# Patient Record
Sex: Female | Born: 1970 | Race: White | Hispanic: No | Marital: Married | Smoking: Never smoker
Health system: Southern US, Community
[De-identification: ages and names within clinical notes are randomized; demographics above are authoritative.]

## PROBLEM LIST (undated history)

## (undated) DIAGNOSIS — H9190 Unspecified hearing loss, unspecified ear: Secondary | ICD-10-CM

## (undated) DIAGNOSIS — K589 Irritable bowel syndrome without diarrhea: Secondary | ICD-10-CM

## (undated) DIAGNOSIS — K219 Gastro-esophageal reflux disease without esophagitis: Secondary | ICD-10-CM

## (undated) HISTORY — DX: Irritable bowel syndrome, unspecified: K58.9

## (undated) HISTORY — DX: Gastro-esophageal reflux disease without esophagitis: K21.9

---
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
## 1992-01-06 HISTORY — PX: EAR EXAMINATION UNDER ANESTHESIA: SHX1482

## 1996-01-06 HISTORY — PX: LEEP: SHX91

## 1996-01-06 HISTORY — PX: OOPHORECTOMY: SHX86

## 2000-06-23 ENCOUNTER — Other Ambulatory Visit: Admission: RE | Admit: 2000-06-23 | Discharge: 2000-06-23 | Payer: Self-pay | Admitting: *Deleted

## 2001-02-14 ENCOUNTER — Other Ambulatory Visit: Admission: RE | Admit: 2001-02-14 | Discharge: 2001-02-14 | Payer: Self-pay | Admitting: Gynecology

## 2002-03-07 ENCOUNTER — Other Ambulatory Visit: Admission: RE | Admit: 2002-03-07 | Discharge: 2002-03-07 | Payer: Self-pay | Admitting: Gynecology

## 2005-07-21 ENCOUNTER — Other Ambulatory Visit: Admission: RE | Admit: 2005-07-21 | Discharge: 2005-07-21 | Payer: Self-pay | Admitting: Obstetrics & Gynecology

## 2006-08-12 ENCOUNTER — Other Ambulatory Visit: Admission: RE | Admit: 2006-08-12 | Discharge: 2006-08-12 | Payer: Self-pay | Admitting: Obstetrics and Gynecology

## 2008-01-10 ENCOUNTER — Other Ambulatory Visit: Admission: RE | Admit: 2008-01-10 | Discharge: 2008-01-10 | Payer: Self-pay | Admitting: Obstetrics and Gynecology

## 2011-11-18 ENCOUNTER — Other Ambulatory Visit: Payer: Self-pay | Admitting: Obstetrics and Gynecology

## 2011-11-18 DIAGNOSIS — Z1231 Encounter for screening mammogram for malignant neoplasm of breast: Secondary | ICD-10-CM

## 2012-01-06 HISTORY — PX: BREAST EXCISIONAL BIOPSY: SUR124

## 2012-01-08 ENCOUNTER — Ambulatory Visit
Admission: RE | Admit: 2012-01-08 | Discharge: 2012-01-08 | Disposition: A | Discharge status: Home / Self Care | Payer: BC Managed Care – PPO | Source: Ambulatory Visit | Attending: Obstetrics and Gynecology | Admitting: Obstetrics and Gynecology

## 2012-01-08 DIAGNOSIS — Z1231 Encounter for screening mammogram for malignant neoplasm of breast: Secondary | ICD-10-CM

## 2012-01-14 ENCOUNTER — Other Ambulatory Visit: Payer: Self-pay | Admitting: Obstetrics and Gynecology

## 2012-01-14 DIAGNOSIS — R928 Other abnormal and inconclusive findings on diagnostic imaging of breast: Secondary | ICD-10-CM

## 2012-01-22 ENCOUNTER — Ambulatory Visit
Admission: RE | Admit: 2012-01-22 | Discharge: 2012-01-22 | Disposition: A | Discharge status: Home / Self Care | Payer: BC Managed Care – PPO | Source: Ambulatory Visit | Attending: Obstetrics and Gynecology | Admitting: Obstetrics and Gynecology

## 2012-01-22 DIAGNOSIS — R928 Other abnormal and inconclusive findings on diagnostic imaging of breast: Secondary | ICD-10-CM

## 2012-06-21 ENCOUNTER — Other Ambulatory Visit: Payer: Self-pay | Admitting: Nurse Practitioner

## 2012-06-21 DIAGNOSIS — R921 Mammographic calcification found on diagnostic imaging of breast: Secondary | ICD-10-CM

## 2012-06-28 ENCOUNTER — Ambulatory Visit
Admission: RE | Admit: 2012-06-28 | Discharge: 2012-06-28 | Disposition: A | Discharge status: Home / Self Care | Payer: BC Managed Care – PPO | Source: Ambulatory Visit | Attending: Nurse Practitioner | Admitting: Nurse Practitioner

## 2012-06-28 DIAGNOSIS — R921 Mammographic calcification found on diagnostic imaging of breast: Secondary | ICD-10-CM

## 2012-07-11 ENCOUNTER — Ambulatory Visit
Admission: RE | Admit: 2012-07-11 | Discharge: 2012-07-11 | Disposition: A | Discharge status: Home / Self Care | Payer: BC Managed Care – PPO | Source: Ambulatory Visit | Attending: Nurse Practitioner | Admitting: Nurse Practitioner

## 2012-07-11 DIAGNOSIS — R921 Mammographic calcification found on diagnostic imaging of breast: Secondary | ICD-10-CM

## 2012-07-14 ENCOUNTER — Telehealth: Payer: Self-pay | Admitting: Nurse Practitioner

## 2012-07-14 NOTE — Telephone Encounter (Signed)
Patient would  like to talk to speak Shirlyn Goltz before her consult tomorrow . Please call her back to discuss her concerns.

## 2012-07-14 NOTE — Telephone Encounter (Signed)
Patient had first MMG in Jan 2014 and 6 mth follow up was ordered.  She had this 06-2012 and biopsy of calcification was unsuccessful.  Initially they said they would just follow up in 6 months.  Then after reviewing films,a change was noted so she was referred to surgeon for removal.  Patient just would like Patty's opinion.  Is this "overkill" to be referred to surgeon?  Would a surgeon really give any option other than surgery?  Patient would just like an impartial opinion of someone that knows her.  Support given and advised that we almost always agree with referral to surgeons and even sometimes refer to surgeon when lump is radiologically ok.  Also encouraged to see 2nd opinion if desires as it is most important that SHE is comfortable with plan.  Would just like to hear from [facility] about all of this.

## 2012-07-14 NOTE — Telephone Encounter (Signed)
Discussed with patient her Mammo findings and need to see Dr. Jamey Ripa.  We reviewed Mammo from January and this last one.  I agree with radiologist to have surgical opinion.  Patient feels more comfortable with decision.

## 2012-07-15 ENCOUNTER — Encounter (INDEPENDENT_AMBULATORY_CARE_PROVIDER_SITE_OTHER): Payer: Self-pay | Admitting: Surgery

## 2012-07-15 ENCOUNTER — Ambulatory Visit (INDEPENDENT_AMBULATORY_CARE_PROVIDER_SITE_OTHER): Payer: BC Managed Care – PPO | Admitting: Surgery

## 2012-07-15 VITALS — BP 110/64 | HR 70 | Resp 18 | Ht 64.0 in | Wt 124.0 lb

## 2012-07-15 DIAGNOSIS — R921 Mammographic calcification found on diagnostic imaging of breast: Secondary | ICD-10-CM

## 2012-07-15 DIAGNOSIS — R928 Other abnormal and inconclusive findings on diagnostic imaging of breast: Secondary | ICD-10-CM

## 2012-07-15 NOTE — Progress Notes (Signed)
NAME: Elaine Edwards DOB: 07/17/1970 MRN: [id number]                                                                                      DATE: 07/15/2012  PCP: ROMINE,CYNTHIA P, MD Referring Provider: No ref. provider found  IMPRESSION:  Right breast calcifications, upper outer quadrant, indeterminate, increasing, not amenable to NCB  PLAN:   I believe we should proceed with NL excsion, but an alternative is a 6 mont h f/u mammo. The calcs are slightly worrisome on films, and have somewhat increased.   I have discussed the indications for the lumpectomy and described the procedure. She understand that the chance of removal of the abnormal area is very good, but that occasionally we are unable to locate it and may have to do a second procedure. We also discussed the possibility of a second procedure to get additional tissue. Risks of surgery such as bleeding and infection have also been explained, as well as the implications of not doing the surgery. She understands and wishes to proceed.                  CC:  Chief Complaint  Patient presents with  . Breast Problem    Eval breast calcification    HPI:  Elaine Edwards is a 41 y.o.  female who presents for evaluation of right breast calcs. They were seenn on a mmo in January, and agin now. They are slighly worrisome, and have somewhat increased over the six month interval. NCB could not be done due to inability to see them well enough on the mammo unit and their proximity to the chest  PMH:  has no past medical history on file.  PSH:   has past surgical history that includes right ovary removed.  ALLERGIES:  Not on File  MEDICATIONS: No current outpatient prescriptions on file.  ROS: She has filled out our 12 point review of systems and it is negative . EXAM:   VITAL SIGNS:  BP 110/64  Pulse 70  Resp 18  Ht 5' 4" (1.626 m)  Wt 124 lb (56.246 kg)  BMI 21.27 kg/m2  GENERAL:  The patient is alert, oriented, and  generally healthy-appearing, NAD. Mood and affect are normal.  HEENT:  The head is normocephalic, the eyes nonicteric, the pupils were round regular and equal. EOMs are normal. Pharynx normal. Dentition good.  NECK:  The neck is supple and there are no masses or thyromegaly.  LUNGS: Normal respirations and clear to auscultation.  HEART: Regular rhythm, with no murmurs rubs or gallops. Pulses are intact carotid dorsalis pedis and posterior tibial. No significant varicosities are noted.  BREASTS:  Breasts are somewhat dense, no mass, not tender, skin, nipple areola OK.  Lymphatics: no axillary or supraclavicular adenopathy  ABDOMEN: Soft, flat, and nontender. No masses or organomegaly is noted. No hernias are noted. Bowel sounds are normal.  EXTREMITIES:  Good range of motion, no edema.   DATA REVIEWED:  I have reviewed the mammo films and reports with the radiologist    Dennise Bamber J 07/15/2012  CC: No ref. provider found, ROMINE,CYNTHIA P, MD        

## 2012-07-15 NOTE — Patient Instructions (Signed)
I will call you this afternoon after I have a chance to review the mammogram films

## 2012-08-04 ENCOUNTER — Encounter (HOSPITAL_BASED_OUTPATIENT_CLINIC_OR_DEPARTMENT_OTHER): Payer: Self-pay | Admitting: *Deleted

## 2012-08-04 NOTE — Progress Notes (Signed)
No health issues 

## 2012-08-10 ENCOUNTER — Encounter (HOSPITAL_BASED_OUTPATIENT_CLINIC_OR_DEPARTMENT_OTHER)
Admission: RE | Disposition: A | Discharge status: Home / Self Care | Payer: Self-pay | Source: Ambulatory Visit | Attending: Surgery

## 2012-08-10 ENCOUNTER — Ambulatory Visit
Admission: RE | Admit: 2012-08-10 | Discharge: 2012-08-10 | Disposition: A | Discharge status: Home / Self Care | Payer: BC Managed Care – PPO | Source: Ambulatory Visit | Attending: Surgery | Admitting: Surgery

## 2012-08-10 ENCOUNTER — Encounter (HOSPITAL_BASED_OUTPATIENT_CLINIC_OR_DEPARTMENT_OTHER): Payer: Self-pay | Admitting: Certified Registered Nurse Anesthetist

## 2012-08-10 ENCOUNTER — Ambulatory Visit (HOSPITAL_BASED_OUTPATIENT_CLINIC_OR_DEPARTMENT_OTHER): Payer: BC Managed Care – PPO | Admitting: Certified Registered Nurse Anesthetist

## 2012-08-10 ENCOUNTER — Ambulatory Visit (HOSPITAL_BASED_OUTPATIENT_CLINIC_OR_DEPARTMENT_OTHER)
Admission: RE | Admit: 2012-08-10 | Discharge: 2012-08-10 | Disposition: A | Discharge status: Home / Self Care | Payer: BC Managed Care – PPO | Source: Ambulatory Visit | Attending: Surgery | Admitting: Surgery

## 2012-08-10 DIAGNOSIS — N6019 Diffuse cystic mastopathy of unspecified breast: Secondary | ICD-10-CM

## 2012-08-10 DIAGNOSIS — R921 Mammographic calcification found on diagnostic imaging of breast: Secondary | ICD-10-CM

## 2012-08-10 DIAGNOSIS — N62 Hypertrophy of breast: Secondary | ICD-10-CM | POA: Insufficient documentation

## 2012-08-10 DIAGNOSIS — D249 Benign neoplasm of unspecified breast: Secondary | ICD-10-CM

## 2012-08-10 HISTORY — DX: Unspecified hearing loss, unspecified ear: H91.90

## 2012-08-10 HISTORY — PX: BREAST BIOPSY: SHX20

## 2012-08-10 LAB — POCT HEMOGLOBIN-HEMACUE: Hemoglobin: 13 g/dL (ref 12.0–15.0)

## 2012-08-10 SURGERY — BREAST BIOPSY WITH NEEDLE LOCALIZATION
Anesthesia: General | Site: Breast | Laterality: Right | Wound class: Clean

## 2012-08-10 MED ORDER — OXYCODONE HCL 5 MG/5ML PO SOLN: 5.0000 mg | Freq: Once | ORAL | Status: DC | PRN

## 2012-08-10 MED ORDER — LIDOCAINE HCL (CARDIAC) 20 MG/ML IV SOLN
INTRAVENOUS | Status: DC | PRN
  Administered 2012-08-10: 60 mg via INTRAVENOUS

## 2012-08-10 MED ORDER — PROPOFOL 10 MG/ML IV BOLUS
INTRAVENOUS | Status: DC | PRN
  Administered 2012-08-10: 200 mg via INTRAVENOUS

## 2012-08-10 MED ORDER — FENTANYL CITRATE 0.05 MG/ML IJ SOLN
INTRAMUSCULAR | Status: DC | PRN
  Administered 2012-08-10: 25 ug via INTRAVENOUS
  Administered 2012-08-10: 100 ug via INTRAVENOUS

## 2012-08-10 MED ORDER — MIDAZOLAM HCL 5 MG/5ML IJ SOLN
INTRAMUSCULAR | Status: DC | PRN
  Administered 2012-08-10: 2 mg via INTRAVENOUS

## 2012-08-10 MED ORDER — HYDROCODONE-ACETAMINOPHEN 5-325 MG PO TABS: 1.0000 | ORAL_TABLET | ORAL | Status: DC | PRN

## 2012-08-10 MED ORDER — ONDANSETRON HCL 4 MG/2ML IJ SOLN: 4.0000 mg | Freq: Once | INTRAMUSCULAR | Status: DC | PRN

## 2012-08-10 MED ORDER — CEFAZOLIN SODIUM-DEXTROSE 2-3 GM-% IV SOLR
2.0000 g | INTRAVENOUS | Status: AC
  Administered 2012-08-10: 2 g via INTRAVENOUS

## 2012-08-10 MED ORDER — ONDANSETRON HCL 4 MG/2ML IJ SOLN
INTRAMUSCULAR | Status: DC | PRN
  Administered 2012-08-10: 4 mg via INTRAVENOUS

## 2012-08-10 MED ORDER — DEXAMETHASONE SODIUM PHOSPHATE 4 MG/ML IJ SOLN
INTRAMUSCULAR | Status: DC | PRN
  Administered 2012-08-10: 10 mg via INTRAVENOUS

## 2012-08-10 MED ORDER — CHLORHEXIDINE GLUCONATE 4 % EX LIQD: 1.0000 "application " | Freq: Once | CUTANEOUS | Status: DC

## 2012-08-10 MED ORDER — OXYCODONE HCL 5 MG PO TABS: 5.0000 mg | ORAL_TABLET | Freq: Once | ORAL | Status: DC | PRN

## 2012-08-10 MED ORDER — HYDROMORPHONE HCL PF 1 MG/ML IJ SOLN
0.2500 mg | INTRAMUSCULAR | Status: DC | PRN
  Administered 2012-08-10 (×3): 0.25 mg via INTRAVENOUS

## 2012-08-10 MED ORDER — LACTATED RINGERS IV SOLN
INTRAVENOUS | Status: DC
  Administered 2012-08-10 (×2): via INTRAVENOUS

## 2012-08-10 MED ORDER — BUPIVACAINE HCL (PF) 0.25 % IJ SOLN
INTRAMUSCULAR | Status: DC | PRN
  Administered 2012-08-10: 20 mL

## 2012-08-10 MED ORDER — MIDAZOLAM HCL 2 MG/2ML IJ SOLN: 1.0000 mg | INTRAMUSCULAR | Status: DC | PRN

## 2012-08-10 MED ORDER — FENTANYL CITRATE 0.05 MG/ML IJ SOLN: 50.0000 ug | INTRAMUSCULAR | Status: DC | PRN

## 2012-08-10 SURGICAL SUPPLY — 51 items
ADH SKN CLS APL DERMABOND .7 (GAUZE/BANDAGES/DRESSINGS) ×1
APPLICATOR COTTON TIP 6IN STRL (MISCELLANEOUS) IMPLANT
BINDER BREAST LRG (GAUZE/BANDAGES/DRESSINGS) IMPLANT
BINDER BREAST MEDIUM (GAUZE/BANDAGES/DRESSINGS) ×1 IMPLANT
BINDER BREAST XLRG (GAUZE/BANDAGES/DRESSINGS) IMPLANT
BINDER BREAST XXLRG (GAUZE/BANDAGES/DRESSINGS) IMPLANT
BLADE HEX COATED 2.75 (ELECTRODE) ×2 IMPLANT
BLADE SURG 15 STRL LF DISP TIS (BLADE) ×1 IMPLANT
BLADE SURG 15 STRL SS (BLADE) ×2
CANISTER SUCTION 1200CC (MISCELLANEOUS) ×2 IMPLANT
CHLORAPREP W/TINT 26ML (MISCELLANEOUS) ×2 IMPLANT
CLIP TI MEDIUM 6 (CLIP) IMPLANT
CLIP TI WIDE RED SMALL 6 (CLIP) IMPLANT
CLOTH BEACON ORANGE TIMEOUT ST (SAFETY) ×2 IMPLANT
COVER MAYO STAND STRL (DRAPES) ×2 IMPLANT
COVER TABLE BACK 60X90 (DRAPES) ×2 IMPLANT
DECANTER SPIKE VIAL GLASS SM (MISCELLANEOUS) ×1 IMPLANT
DERMABOND ADVANCED (GAUZE/BANDAGES/DRESSINGS) ×1
DERMABOND ADVANCED .7 DNX12 (GAUZE/BANDAGES/DRESSINGS) ×1 IMPLANT
DEVICE DUBIN W/COMP PLATE 8390 (MISCELLANEOUS) ×2 IMPLANT
DRAPE LAPAROTOMY TRNSV 102X78 (DRAPE) ×2 IMPLANT
DRAPE SURG 17X23 STRL (DRAPES) IMPLANT
DRAPE UTILITY XL STRL (DRAPES) ×2 IMPLANT
ELECT COATED BLADE 2.86 ST (ELECTRODE) ×1 IMPLANT
ELECT REM PT RETURN 9FT ADLT (ELECTROSURGICAL) ×2
ELECTRODE REM PT RTRN 9FT ADLT (ELECTROSURGICAL) ×1 IMPLANT
GLOVE BIOGEL PI IND STRL 6.5 (GLOVE) ×1 IMPLANT
GLOVE BIOGEL PI INDICATOR 6.5 (GLOVE) ×1
GLOVE ECLIPSE 6.5 STRL STRAW (GLOVE) ×2 IMPLANT
GLOVE EUDERMIC 7 POWDERFREE (GLOVE) ×2 IMPLANT
GLOVE EXAM NITRILE MD LF STRL (GLOVE) ×1 IMPLANT
GOWN PREVENTION PLUS XLARGE (GOWN DISPOSABLE) ×4 IMPLANT
KIT MARKER MARGIN INK (KITS) IMPLANT
NDL HYPO 25X1 1.5 SAFETY (NEEDLE) ×1 IMPLANT
NEEDLE HYPO 25X1 1.5 SAFETY (NEEDLE) ×2 IMPLANT
NS IRRIG 1000ML POUR BTL (IV SOLUTION) ×2 IMPLANT
PACK BASIN DAY SURGERY FS (CUSTOM PROCEDURE TRAY) ×2 IMPLANT
PENCIL BUTTON HOLSTER BLD 10FT (ELECTRODE) ×2 IMPLANT
SHEET MEDIUM DRAPE 40X70 STRL (DRAPES) IMPLANT
SLEEVE SCD COMPRESS KNEE MED (MISCELLANEOUS) ×2 IMPLANT
SPONGE GAUZE 4X4 12PLY (GAUZE/BANDAGES/DRESSINGS) IMPLANT
SPONGE INTESTINAL PEANUT (DISPOSABLE) IMPLANT
SPONGE LAP 4X18 X RAY DECT (DISPOSABLE) ×2 IMPLANT
STAPLER VISISTAT 35W (STAPLE) IMPLANT
SUT MNCRL AB 4-0 PS2 18 (SUTURE) ×2 IMPLANT
SUT SILK 0 TIES 10X30 (SUTURE) IMPLANT
SUT VICRYL 3-0 CR8 SH (SUTURE) ×2 IMPLANT
SYR CONTROL 10ML LL (SYRINGE) ×2 IMPLANT
TOWEL OR NON WOVEN STRL DISP B (DISPOSABLE) ×2 IMPLANT
TUBE CONNECTING 20X1/4 (TUBING) ×2 IMPLANT
YANKAUER SUCT BULB TIP NO VENT (SUCTIONS) ×2 IMPLANT

## 2012-08-10 NOTE — H&P (View-Only) (Signed)
NAME: REGINIA BATTIE DOB: 22-Jan-1970 MRN: [id number]                                                                                      DATE: 07/15/2012  PCP: Alison Murray, MD Referring Provider: No ref. provider found  IMPRESSION:  Right breast calcifications, upper outer quadrant, indeterminate, increasing, not amenable to NCB  PLAN:   I believe we should proceed with NL excsion, but an alternative is a 6 mont h f/u mammo. The calcs are slightly worrisome on films, and have somewhat increased.   I have discussed the indications for the lumpectomy and described the procedure. She understand that the chance of removal of the abnormal area is very good, but that occasionally we are unable to locate it and may have to do a second procedure. We also discussed the possibility of a second procedure to get additional tissue. Risks of surgery such as bleeding and infection have also been explained, as well as the implications of not doing the surgery. She understands and wishes to proceed.                  CC:  Chief Complaint  Patient presents with  . Breast Problem    Eval breast calcification    HPI:  NIDA MANFREDI is a 42 y.o.  female who presents for evaluation of right breast calcs. They were seenn on a mmo in January, and agin now. They are slighly worrisome, and have somewhat increased over the six month interval. NCB could not be done due to inability to see them well enough on the mammo unit and their proximity to the chest  PMH:  has no past medical history on file.  PSH:   has past surgical history that includes right ovary removed.  ALLERGIES:  Not on File  MEDICATIONS: No current outpatient prescriptions on file.  ROS: She has filled out our 12 point review of systems and it is negative . EXAM:   VITAL SIGNS:  BP 110/64  Pulse 70  Resp 18  Ht 5\' 4"  (1.626 m)  Wt 124 lb (56.246 kg)  BMI 21.27 kg/m2  GENERAL:  The patient is alert, oriented, and  generally healthy-appearing, NAD. Mood and affect are normal.  HEENT:  The head is normocephalic, the eyes nonicteric, the pupils were round regular and equal. EOMs are normal. Pharynx normal. Dentition good.  NECK:  The neck is supple and there are no masses or thyromegaly.  LUNGS: Normal respirations and clear to auscultation.  HEART: Regular rhythm, with no murmurs rubs or gallops. Pulses are intact carotid dorsalis pedis and posterior tibial. No significant varicosities are noted.  BREASTS:  Breasts are somewhat dense, no mass, not tender, skin, nipple areola OK.  Lymphatics: no axillary or supraclavicular adenopathy  ABDOMEN: Soft, flat, and nontender. No masses or organomegaly is noted. No hernias are noted. Bowel sounds are normal.  EXTREMITIES:  Good range of motion, no edema.   DATA REVIEWED:  I have reviewed the mammo films and reports with the radiologist    Niam Nepomuceno J 07/15/2012  CC: No ref. provider found, ROMINE,CYNTHIA P, MD

## 2012-08-10 NOTE — Transfer of Care (Signed)
Immediate Anesthesia Transfer of Care Note  Patient: Elaine Edwards  Procedure(s) Performed: Procedure(s): NEEDLE LOCALIZATION REMOVAL RIGHT BREAST CALCIFICATIONS (Right)  Patient Location: PACU  Anesthesia Type:General  Level of Consciousness: awake, alert , oriented and patient cooperative  Airway & Oxygen Therapy: Patient Spontanous Breathing and Patient connected to face mask oxygen  Post-op Assessment: Report given to PACU RN and Post -op Vital signs reviewed and stable  Post vital signs: Reviewed and stable  Complications: No apparent anesthesia complications

## 2012-08-10 NOTE — Interval H&P Note (Signed)
History and Physical Interval Note:  08/10/2012 11:02 AM  Elaine Edwards  has presented today for surgery, with the diagnosis of right breast calcifications  The various methods of treatment have been discussed with the patient and family. After consideration of risks, benefits and other options for treatment, the patient has consented to  Procedure(s): NEEDLE LOCALIZATION REMOVAL RIGHT BREAST CALCIFICATIONS (Right) as a surgical intervention .  The patient's history has been reviewed, patient examined, no change in status, stable for surgery.  I have reviewed the patient's chart and labs.  Questions were answered to the patient's satisfaction.   The wire films are reviewed and discussed with Dr Jean Rosenthal; the left breast is marked as the operative site  [name] J

## 2012-08-10 NOTE — Interval H&P Note (Signed)
History and Physical Interval Note:  08/10/2012 10:53 AM  Elaine Edwards  has presented today for surgery, with the diagnosis of right breast calcifications  The various methods of treatment have been discussed with the patient and family. After consideration of risks, benefits and other options for treatment, the patient has consented to  Procedure(s): NEEDLE LOCALIZATION REMOVAL RIGHT BREAST CALCIFICATIONS (Right) as a surgical intervention .  The patient's history has been reviewed, patient examined, no change in status, stable for surgery.  I have reviewed the patient's chart and labs.  Questions were answered to the patient's satisfaction.     [name]

## 2012-08-10 NOTE — Anesthesia Postprocedure Evaluation (Signed)
  Anesthesia Post-op Note  Patient: Elaine Edwards  Procedure(s) Performed: Procedure(s): NEEDLE LOCALIZATION REMOVAL RIGHT BREAST CALCIFICATIONS (Right)  Patient Location: PACU  Anesthesia Type:General  Level of Consciousness: awake, alert  and oriented  Airway and Oxygen Therapy: Patient Spontanous Breathing  Post-op Pain: mild  Post-op Assessment: Post-op Vital signs reviewed  Post-op Vital Signs: Reviewed  Complications: No apparent anesthesia complications

## 2012-08-10 NOTE — Anesthesia Preprocedure Evaluation (Addendum)
Anesthesia Evaluation  Patient identified by MRN, date of birth, ID band Patient awake    Reviewed: Allergy & Precautions, NPO status   Airway Mallampati: I TM Distance: >3 FB Neck ROM: Full    Dental  (+) Teeth Intact and Dental Advisory Given   Pulmonary  breath sounds clear to auscultation        Cardiovascular Rhythm:Regular Rate:Normal     Neuro/Psych    GI/Hepatic   Endo/Other    Renal/GU      Musculoskeletal   Abdominal   Peds  Hematology   Anesthesia Other Findings   Reproductive/Obstetrics                          Anesthesia Physical Anesthesia Plan  ASA: I  Anesthesia Plan: General   Post-op Pain Management:    Induction: Intravenous  Airway Management Planned: LMA  Additional Equipment:   Intra-op Plan:   Post-operative Plan: Extubation in OR  Informed Consent: I have reviewed the patients History and Physical, chart, labs and discussed the procedure including the risks, benefits and alternatives for the proposed anesthesia with the patient or authorized representative who has indicated his/her understanding and acceptance.   Dental advisory given  Plan Discussed with: CRNA, Anesthesiologist and Surgeon  Anesthesia Plan Comments:         Anesthesia Quick Evaluation  

## 2012-08-10 NOTE — Op Note (Signed)
Elaine Edwards 30-Sep-1970 [id number] 07/18/2012  Preoperative diagnosis: right breast calcifications, upper outer quadrant, indeterminate,  Postoperative diagnosis: same  Procedure: wire localized excision right breast calcifications  Surgeon: Currie Paris, MD, FACS   Anesthesia: GA combined with regional for post-op pain   Clinical History and Indications: this patient has some slightly increasing but faint calcifications in the upper-outer quadrant of the right breast. After review of her situation she elected to proceed to a wire localized excisional biopsy.    Description of Procedure: I saw the patient in the preoperative area, confirmed the plans with her, more to the right breast as the operative site, and reviewed the wire localizing films with the radiologist. The patient was then taken to the operating room, and after satisfactory general anesthesia was obtained the right breast was prepped and draped and a time out performed.  The guidewire entered in the upper-outer quadrant and tracked horizontally. I made a curvilinear incision below the guidewire to be centered over the mid to medial portion of the wire where the calcifications were located.I raised skin flaps superiorly and then manipulated the wire into the wound. I then excised a cylinder of tissue around the wound taking care to get all of the anterior breast tissue since the calcifications appeared anterior to the guidewire. The tip of the guidewire appeared to be at the pectoral muscle area. I thought I had the area completely excised. A specimen mammogram showed the calcifications in the specimen and were reviewed with the radiologist.  I have treated 20 cc of 0.25% plain Marcaine. I closed the incision in layers after making sure everything was dry. I used 3-0 Vicryl, 4-0 Monocryl subcuticular and Dermabond.  The patient tolerated the procedure well. There were no complications. Counts were correct. Blood loss  was minimal.  Currie Paris, MD, FACS 08/10/2012 12:13 PM

## 2012-08-10 NOTE — Anesthesia Procedure Notes (Signed)
Procedure Name: LMA Insertion Date/Time: 08/10/2012 11:14 AM Performed by: Caren Macadam Pre-anesthesia Checklist: Patient identified, Emergency Drugs available, Suction available and Patient being monitored Patient Re-evaluated:Patient Re-evaluated prior to inductionOxygen Delivery Method: Circle System Utilized Preoxygenation: Pre-oxygenation with 100% oxygen Intubation Type: IV induction Ventilation: Mask ventilation without difficulty LMA: LMA inserted LMA Size: 4.0 Number of attempts: 1 Airway Equipment and Method: bite block Placement Confirmation: positive ETCO2 and breath sounds checked- equal and bilateral Tube secured with: Tape Dental Injury: Teeth and Oropharynx as per pre-operative assessment

## 2012-08-12 ENCOUNTER — Telehealth (INDEPENDENT_AMBULATORY_CARE_PROVIDER_SITE_OTHER): Payer: Self-pay

## 2012-08-12 ENCOUNTER — Encounter (HOSPITAL_BASED_OUTPATIENT_CLINIC_OR_DEPARTMENT_OTHER): Payer: Self-pay | Admitting: Surgery

## 2012-08-12 NOTE — Progress Notes (Signed)
Quick Note:  Tell her path is benign and as expected ______ 

## 2012-08-12 NOTE — Telephone Encounter (Signed)
Spoke with pt, giving her pathology results = benign as expected.

## 2012-08-23 ENCOUNTER — Ambulatory Visit (INDEPENDENT_AMBULATORY_CARE_PROVIDER_SITE_OTHER): Payer: BC Managed Care – PPO | Admitting: Surgery

## 2012-08-23 ENCOUNTER — Encounter (INDEPENDENT_AMBULATORY_CARE_PROVIDER_SITE_OTHER): Payer: Self-pay | Admitting: Surgery

## 2012-08-23 VITALS — BP 102/66 | HR 76 | Resp 14 | Ht 64.5 in | Wt 127.0 lb

## 2012-08-23 DIAGNOSIS — R921 Mammographic calcification found on diagnostic imaging of breast: Secondary | ICD-10-CM

## 2012-08-23 DIAGNOSIS — R928 Other abnormal and inconclusive findings on diagnostic imaging of breast: Secondary | ICD-10-CM

## 2012-08-23 NOTE — Patient Instructions (Signed)
We will see you again on an as needed basis. Please call the office at [phone] if you have any questions or concerns. Thank you for allowing us to take care of you.  

## 2012-08-23 NOTE — Progress Notes (Signed)
NAME: Elaine Edwards                                            DOB: 08-12-1970 DATE: 08/23/2012                                                  MRN: [id number]  CC:  Chief Complaint  Patient presents with  . Routine Post Op    1st po rt br    HPI: This patient comes in for post op follow-up .Sheunderwent wire localized removal of some right breast calcifications on 08/09/12. She feels that she is doing well.  PE:  VITAL SIGNS: BP 102/66  Pulse 76  Resp 14  Ht 5' 4.5" (1.638 m)  Wt 127 lb (57.607 kg)  BMI 21.47 kg/m2  LMP 07/19/2012  General: The patient appears to be healthy, NAD Incision: Healing nicely, no evidence of problems.  DATA REVIEWED: Diagnosis Breast, lumpectomy, Right - FIBROCYSTIC CHANGES WITH CALCIFICTIONS. - PSEUDOANGIOMATOUS STROMAL HYPERPLASIA (PASH). - NO EVIDENCE OF MALIGNANCY IDENTIFIED. Jimmy Picket MD Pathologist, Electronic Signature (Case signed 08/12/2012)  IMPRESSION: The patient is doing well S/P NL exciosn breast calcifications.    PLAN: RTC PRN Discussed with patient and gave her a copy of the path

## 2012-08-26 ENCOUNTER — Telehealth: Payer: Self-pay | Admitting: *Deleted

## 2012-08-26 NOTE — Telephone Encounter (Signed)
Patient out of hold in mammogram book per Shirlyn Goltz and Dr. Edward Jolly.

## 2012-09-08 ENCOUNTER — Telehealth: Payer: Self-pay | Admitting: Nurse Practitioner

## 2012-09-08 NOTE — Telephone Encounter (Signed)
pt has a yeast infection. She has tried the 3 day Monistat but it is not working pt would like an appointment today if possible

## 2012-09-08 NOTE — Telephone Encounter (Signed)
Spoke with pt about an appt tomorrow with PG at 11:15. Pt agreeable.

## 2012-09-09 ENCOUNTER — Encounter: Payer: Self-pay | Admitting: Nurse Practitioner

## 2012-09-09 ENCOUNTER — Ambulatory Visit (INDEPENDENT_AMBULATORY_CARE_PROVIDER_SITE_OTHER): Payer: BC Managed Care – PPO | Admitting: Nurse Practitioner

## 2012-09-09 VITALS — BP 100/60 | HR 68 | Wt 125.0 lb

## 2012-09-09 DIAGNOSIS — B373 Candidiasis of vulva and vagina: Secondary | ICD-10-CM

## 2012-09-09 DIAGNOSIS — B3731 Acute candidiasis of vulva and vagina: Secondary | ICD-10-CM

## 2012-09-09 MED ORDER — FLUCONAZOLE 150 MG PO TABS: 150.0000 mg | ORAL_TABLET | Freq: Once | ORAL | Status: DC

## 2012-09-09 NOTE — Patient Instructions (Signed)
Monilial Vaginitis  Vaginitis in a soreness, swelling and redness (inflammation) of the vagina and vulva. Monilial vaginitis is not a sexually transmitted infection.  CAUSES   Yeast vaginitis is caused by yeast (candida) that is normally found in your vagina. With a yeast infection, the candida has overgrown in number to a point that upsets the chemical balance.  SYMPTOMS   · White, thick vaginal discharge.  · Swelling, itching, redness and irritation of the vagina and possibly the lips of the vagina (vulva).  · Burning or painful urination.  · Painful intercourse.  DIAGNOSIS   Things that may contribute to monilial vaginitis are:  · Postmenopausal and virginal states.  · Pregnancy.  · Infections.  · Being tired, sick or stressed, especially if you had monilial vaginitis in the past.  · Diabetes. Good control will help lower the chance.  · Birth control pills.  · Tight fitting garments.  · Using bubble bath, feminine sprays, douches or deodorant tampons.  · Taking certain medications that kill germs (antibiotics).  · Sporadic recurrence can occur if you become ill.  TREATMENT   Your caregiver will give you medication.  · There are several kinds of anti monilial vaginal creams and suppositories specific for monilial vaginitis. For recurrent yeast infections, use a suppository or cream in the vagina 2 times a week, or as directed.  · Anti-monilial or steroid cream for the itching or irritation of the vulva may also be used. Get your caregiver's permission.  · Painting the vagina with methylene blue solution may help if the monilial cream does not work.  · Eating yogurt may help prevent monilial vaginitis.  HOME CARE INSTRUCTIONS   · Finish all medication as prescribed.  · Do not have sex until treatment is completed or after your caregiver tells you it is okay.  · Take warm sitz baths.  · Do not douche.  · Do not use tampons, especially scented ones.  · Wear cotton underwear.  · Avoid tight pants and panty  hose.  · Tell your sexual partner that you have a yeast infection. They should go to their caregiver if they have symptoms such as mild rash or itching.  · Your sexual partner should be treated as well if your infection is difficult to eliminate.  · Practice safer sex. Use condoms.  · Some vaginal medications cause latex condoms to fail. Vaginal medications that harm condoms are:  · Cleocin cream.  · Butoconazole (Femstat®).  · Terconazole (Terazol®) vaginal suppository.  · Miconazole (Monistat®) (may be purchased over the counter).  SEEK MEDICAL CARE IF:   · You have a temperature by mouth above 102° F (38.9° C).  · The infection is getting worse after 2 days of treatment.  · The infection is not getting better after 3 days of treatment.  · You develop blisters in or around your vagina.  · You develop vaginal bleeding, and it is not your menstrual period.  · You have pain when you urinate.  · You develop intestinal problems.  · You have pain with sexual intercourse.  Document Released: 10/01/2004 Document Revised: 03/16/2011 Document Reviewed: 06/15/2008  [employer]® Patient Information ©2014 [employer].

## 2012-09-09 NOTE — Progress Notes (Signed)
Subjective:     Patient ID: Elaine Edwards, female   DOB: [birth year], 42 y.o.   MRN: [id number]  Vaginal Itching The patient's pertinent negatives include no pelvic pain or vaginal discharge. Pertinent negatives include no chills, dysuria, fever, flank pain or urgency.   42 yo WM Fe with yeast vaginitis since last Monday. Slight itching after returning from the beach and used OTC Monistat 3 night treatment.  She had such a reaction after each dose of Monistat that required ice pack for two hours.she is better with vaginal discharge but feels irritated externally.  No bladder symptoms, fever, chills.  Review of Systems  Constitutional: Negative for fever, chills and fatigue.  HENT: Negative.   Respiratory: Negative.   Cardiovascular: Negative.   Gastrointestinal: Negative.   Genitourinary: Positive for vaginal pain. Negative for dysuria, urgency, flank pain, decreased urine volume, vaginal discharge, genital sores and pelvic pain.       No current vaginal pain but did have symptoms with dosing of Monistat.  Neurological: Negative.   Psychiatric/Behavioral: Negative.        Objective:   Physical Exam  Constitutional: She is oriented to person, place, and time. She appears well-developed and well-nourished.  Abdominal: Soft. She exhibits no distension and no mass. There is no tenderness. There is no rebound and no guarding.  Genitourinary:  No vaginal discharge or lesions. Externally at the peri anal areas there is irritation consistent with yeast.  Neurological: She is alert and oriented to person, place, and time.  Psychiatric: She has a normal mood and affect. Her behavior is normal. Judgment and thought content normal.       Assessment:     Yeast vaginitis resolved External yeast symptoms.    Plan:     Diflucan 150 mg X 2 doses (may only need 1 tablet) Will use ORC hydrocortisone to relieve discomfort and itching prn.  If symptoms not improved to return.

## 2012-09-11 NOTE — Progress Notes (Signed)
Encounter reviewed by Dr. Brook Silva.  

## 2012-11-10 ENCOUNTER — Other Ambulatory Visit: Payer: Self-pay

## 2012-12-06 ENCOUNTER — Ambulatory Visit: Payer: Self-pay | Admitting: Nurse Practitioner

## 2012-12-19 ENCOUNTER — Encounter: Payer: Self-pay | Admitting: Nurse Practitioner

## 2012-12-20 ENCOUNTER — Ambulatory Visit: Payer: Self-pay | Admitting: Nurse Practitioner

## 2014-03-30 ENCOUNTER — Other Ambulatory Visit: Payer: Self-pay

## 2014-03-30 DIAGNOSIS — Z1231 Encounter for screening mammogram for malignant neoplasm of breast: Secondary | ICD-10-CM

## 2014-04-06 ENCOUNTER — Ambulatory Visit
Admission: RE | Admit: 2014-04-06 | Discharge: 2014-04-06 | Disposition: A | Discharge status: Home / Self Care | Payer: Commercial Managed Care - PPO | Source: Ambulatory Visit

## 2014-04-06 DIAGNOSIS — Z1231 Encounter for screening mammogram for malignant neoplasm of breast: Secondary | ICD-10-CM

## 2014-08-03 ENCOUNTER — Ambulatory Visit: Payer: Self-pay | Admitting: Nurse Practitioner

## 2014-09-03 ENCOUNTER — Encounter: Payer: Self-pay | Admitting: Nurse Practitioner

## 2014-09-03 ENCOUNTER — Ambulatory Visit (INDEPENDENT_AMBULATORY_CARE_PROVIDER_SITE_OTHER): Payer: Commercial Managed Care - PPO | Admitting: Nurse Practitioner

## 2014-09-03 ENCOUNTER — Ambulatory Visit: Payer: Self-pay | Admitting: Nurse Practitioner

## 2014-09-03 VITALS — BP 110/76 | HR 68 | Ht 64.5 in | Wt 128.0 lb

## 2014-09-03 DIAGNOSIS — N760 Acute vaginitis: Secondary | ICD-10-CM

## 2014-09-03 DIAGNOSIS — Z0000 Encounter for general adult medical examination without abnormal findings: Secondary | ICD-10-CM | POA: Diagnosis not present

## 2014-09-03 DIAGNOSIS — Z01419 Encounter for gynecological examination (general) (routine) without abnormal findings: Secondary | ICD-10-CM

## 2014-09-03 LAB — POCT URINALYSIS DIPSTICK
BILIRUBIN UA: NEGATIVE
GLUCOSE UA: NEGATIVE
Ketones, UA: NEGATIVE
Leukocytes, UA: NEGATIVE
Nitrite, UA: NEGATIVE
Protein, UA: NEGATIVE
RBC UA: NEGATIVE
Urobilinogen, UA: NEGATIVE
pH, UA: 6

## 2014-09-03 MED ORDER — FLUCONAZOLE 150 MG PO TABS: 150.0000 mg | ORAL_TABLET | Freq: Once | ORAL | Status: DC

## 2014-09-03 NOTE — Progress Notes (Signed)
Patient ID: Elaine Edwards, female   DOB: 1970/03/17, 44 y.o.   MRN: [id number] 44 y.o. G0P0 Married  Caucasian Fe here for annual exam.  Recent yeast infection that started 3 days ago.  Has recently been to the beach and she is a runner.  Menses is fairly normal at 25 -29 days.  Last for 3-4 days and moderate flow.  Some cramps.  Wants to know about a hormone test to see if in perimenopause.  Having an increase in mood changes this past year.  Patient's last menstrual period was 08/21/2014 (exact date).          Sexually active: Yes.    The current method of family planning is condoms sometimes.    Exercising: Yes.    cardio and weight training 4 times weekly Smoker:  no  Health Maintenance: Pap:  11/30/11, Negative with neg HR HPV; LEEP 1998 MMG:  04/06/14, Bi-Rads 1:  Negative TDaP:  07/2011 Labs:  HB: 13.3   Urine: negative   reports that she has never smoked. She has never used smokeless tobacco. She reports that she does not drink alcohol or use illicit drugs.  Past Medical History  Diagnosis Date  . 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 Medical history non-contributory   . IBS (irritable bowel syndrome)     Past Surgical History  Procedure Laterality Date  . Oophorectomy Right 1998    benign tumor  . Ear examination under anesthesia  1994    left  . Breast biopsy Right 08/10/2012    Procedure: NEEDLE LOCALIZATION REMOVAL RIGHT BREAST CALCIFICATIONS;  Surgeon: Haywood Lasso, MD;  Location: [place];  Service: General;  Laterality: Right;  . [place]    pathology not in Epic - will do pap X 20 years    Current Outpatient Prescriptions  Medication Sig Dispense Refill  . Multiple Vitamins-Minerals (MULTIVITAMIN WITH MINERALS) tablet Take 1 tablet by mouth daily.    . fluconazole (DIFLUCAN) 150 MG tablet Take 1 tablet (150 mg total) by mouth once. Take one tablet.  Repeat in 48 hours if symptoms are not completely resolved. 2 tablet 0   No current  facility-administered medications for this visit.    Family History  Problem Relation Age of Onset  . Hyperlipidemia Mother   . Hypertension Mother   . Diabetes Father   . Cancer Maternal Grandfather 60    esophageal    ROS:  Pertinent items are noted in HPI.  Otherwise, a comprehensive ROS was negative.  Exam:   BP 110/76 mmHg  Pulse 68  Ht 5' 4.5" (1.638 m)  Wt 128 lb (58.06 kg)  BMI 21.64 kg/m2  LMP 08/21/2014 (Exact Date) Height: 5' 4.5" (163.8 cm) Ht Readings from Last 3 Encounters:  09/03/14 5' 4.5" (1.638 m)  08/23/12 5' 4.5" (1.638 m)  08/10/12 5\' 4"  (1.626 m)    General appearance: alert, cooperative and appears stated age Head: Normocephalic, without obvious abnormality, atraumatic Neck: no adenopathy, supple, symmetrical, trachea midline and thyroid normal to inspection and palpation Lungs: clear to auscultation bilaterally Breasts: normal appearance, no masses or tenderness Heart: regular rate and rhythm Abdomen: soft, non-tender; no masses,  no organomegaly Extremities: extremities normal, atraumatic, no cyanosis or edema Skin: Skin color, texture, turgor normal. No rashes or lesions Lymph nodes: Cervical, supraclavicular, and axillary nodes normal. No abnormal inguinal nodes palpated Neurologic: Grossly normal   Pelvic: External genitalia:  no lesions  Urethra:  normal appearing urethra with no masses, tenderness or lesions              Bartholin's and Skene's: normal                 Vagina: normal appearing vagina with normal color and white vaginal discharge, no lesions              Cervix: anteverted              Pap taken: Yes.   Bimanual Exam:  Uterus:  normal size, contour, position, consistency, mobility, non-tender              Adnexa: no mass, fullness, tenderness               Rectovaginal: Confirms               Anus:  normal sphincter tone, no lesions  Chaperone present: yes  A:  Well Woman with normal exam  Perimenopausal  symptoms  history of LEEP 11/1996 no pathology available - pap for 20 years  Vaginitis     P:   Reviewed health and wellness pertinent to exam  Pap smear as above  Mammogram is due 04/2015  Will follow with Affirm testing  Will go ahead and give her Diflucan since so symptomatic  Discussed that [facility] was not a good test to do with various readings at this age  Counseled on breast self exam, mammography screening, adequate intake of calcium and vitamin D, diet and exercise return annually or prn  An After Visit Summary was printed and given to the patient.

## 2014-09-03 NOTE — Patient Instructions (Addendum)

## 2014-09-04 ENCOUNTER — Other Ambulatory Visit: Payer: Self-pay | Admitting: Nurse Practitioner

## 2014-09-04 ENCOUNTER — Telehealth: Payer: Self-pay | Admitting: *Deleted

## 2014-09-04 LAB — WET PREP BY MOLECULAR PROBE
CANDIDA SPECIES: POSITIVE — AB
Gardnerella vaginalis: POSITIVE — AB
TRICHOMONAS VAG: NEGATIVE

## 2014-09-04 LAB — HEMOGLOBIN, FINGERSTICK: HEMOGLOBIN, FINGERSTICK: 13.3 g/dL (ref 12.0–16.0)

## 2014-09-04 MED ORDER — METRONIDAZOLE 0.75 % VA GEL: 1.0000 | Freq: Every day | VAGINAL | Status: DC

## 2014-09-04 NOTE — Telephone Encounter (Signed)
While speaking to patient regarding AFFIRM results, pt states that she has started menses again.  Pt started today, 09/04/14.  This is two weeks since last menses.  Pt states she has been having menses about every three weeks the last few months, but this is "totally new for me to have a period two weeks since the last."  Pt would like to know if there is anything else that needs to be evaluated.  Patient understands [place] testing may not be accurate due to age.  Pt would like call back with any recommendations or to know if this may be her new "normal".  Please advise.

## 2014-09-05 LAB — IPS PAP TEST WITH HPV

## 2014-09-05 NOTE — Telephone Encounter (Signed)
Dr Quincy Simmonds see note and advise.

## 2014-09-05 NOTE — Progress Notes (Signed)
Encounter reviewed by Dr. Jozy Mcphearson Amundson C. Silva.  

## 2014-09-05 NOTE — Telephone Encounter (Signed)
Consider pregnancy test.  Observational management is reasonable for this patient.  Ultrasound not needed at this time.  Keep bleeding calendar.

## 2014-09-05 NOTE — Telephone Encounter (Signed)
States she has had " irregular menses" since February at 3-4 weeks apart.  This is first time at 2 weeks.  Nothing else abnormal in terms of bleeding.  She does use condoms for birth control and did advise her she had concerns to do UPT.  She will continue to do menses calendar and follow. If any questions or anything else changes to call back.

## 2014-09-14 ENCOUNTER — Ambulatory Visit: Payer: Self-pay | Admitting: Nurse Practitioner

## 2014-11-13 DIAGNOSIS — M222X9 Patellofemoral disorders, unspecified knee: Secondary | ICD-10-CM | POA: Insufficient documentation

## 2015-02-25 ENCOUNTER — Other Ambulatory Visit: Payer: Self-pay

## 2015-02-25 DIAGNOSIS — Z1231 Encounter for screening mammogram for malignant neoplasm of breast: Secondary | ICD-10-CM

## 2015-04-02 ENCOUNTER — Telehealth: Payer: Self-pay | Admitting: Nurse Practitioner

## 2015-04-02 NOTE — Telephone Encounter (Signed)
Patient wants to discuss having night sweats with PG. Patient does not want to come in for an appointment.

## 2015-04-02 NOTE — Telephone Encounter (Signed)
Spoke with patient. Patient states that she has been experiencing increased night sweats and "menopausal symptoms." She is requesting to speak with Kem Boroughs, FNP regarding her symptoms. Advised patient Kem Boroughs, FNP is out of the office this week on vacation. Advised we will need to schedule an office visit to discuss symptoms and further recommendations. She is agreeable. Patient would like to schedule appointment for next week with Kem Boroughs, FNP. Appointment scheduled for 04/08/2015 at 10:15 am with Kem Boroughs, FNP. She is agreeable to date and time.  Routing to provider for final review. Patient agreeable to disposition. Will close encounter.

## 2015-04-08 ENCOUNTER — Ambulatory Visit (INDEPENDENT_AMBULATORY_CARE_PROVIDER_SITE_OTHER): Payer: Commercial Managed Care - PPO | Admitting: Nurse Practitioner

## 2015-04-08 ENCOUNTER — Encounter: Payer: Self-pay | Admitting: Nurse Practitioner

## 2015-04-08 VITALS — BP 110/62 | HR 64 | Resp 12 | Wt 130.2 lb

## 2015-04-08 DIAGNOSIS — N951 Menopausal and female climacteric states: Secondary | ICD-10-CM

## 2015-04-08 LAB — TSH: TSH: 2.74 mIU/L

## 2015-04-08 LAB — PROLACTIN: Prolactin: 5.9 ng/mL

## 2015-04-08 LAB — ESTRADIOL: ESTRADIOL: 19 pg/mL

## 2015-04-08 LAB — FOLLICLE STIMULATING HORMONE: FSH: 85.4 m[IU]/mL

## 2015-04-08 MED ORDER — NORETHIN-ETH ESTRAD-FE BIPHAS 1 MG-10 MCG / 10 MCG PO TABS: 1.0000 | ORAL_TABLET | Freq: Every day | ORAL | Status: DC

## 2015-04-08 NOTE — Patient Instructions (Signed)

## 2015-04-08 NOTE — Progress Notes (Signed)
Subjective:     Patient ID: Elaine Edwards, female   DOB: 07-May-1970, 45 y.o.   MRN: [id number]  HPI This 45 yo G0P0 MW female presents for a consult visit with an increase in vaso symptoms.  Menses is more irregular and now at 15 days late (at the most 42 days apart).  At last visit she was having an increase in mood changes.  She felt this may be related to her new job change but that is all better now.   Now has an increae is night sweats and insomnia. States harder to go to sleep.  Some mood changes that have been worse past 6-8 months. She does have "brain fog".   She is also very tearfull about her weight.  She works out 6 days a week and does good cardio daily.  She is finding that her weight is going up. Mother went through early menipause.  Since last here she has tried OTC Research scientist (medical) without help.   Review of Systems  Constitutional: Positive for unexpected weight change. Negative for activity change and appetite change.  HENT: Negative.   Respiratory: Negative.   Cardiovascular: Negative.   Gastrointestinal: Negative.   Endocrine: Negative for cold intolerance, heat intolerance, polydipsia, polyphagia and polyuria.  Genitourinary: Positive for menstrual problem.  Musculoskeletal: Negative.   Skin: Negative.   Neurological: Negative.   Psychiatric/Behavioral: Positive for sleep disturbance, dysphoric mood and decreased concentration.       Objective:   Physical Exam  Constitutional: She appears well-developed and well-nourished. No distress.  No other exam was indicated.  Neurological: She is alert.  Psychiatric:  Anxious and tearful at times.  Vitals reviewed.      Assessment:     Perimenopausal  S/P right Oophorectomy 1998 secondary to benign tumor Condoms for [facility]    Plan:     Will check hormone levels and follow. Will start her on low dose OCP - Lo Loestrin to start after next menses onset.  This will hopefully try to even out her hormonal changes and regualate  her cycle I did mention Fluoxetine to take at mid cycle X 14 days.  She is very worried that this may cause wt gain and does not want this Tried to encourage her about continuing exercise program. Will plan on a progress report in 3 months or earlier if not doing well.

## 2015-04-10 ENCOUNTER — Other Ambulatory Visit: Payer: Self-pay | Admitting: Nurse Practitioner

## 2015-04-10 ENCOUNTER — Telehealth: Payer: Self-pay | Admitting: Nurse Practitioner

## 2015-04-10 DIAGNOSIS — N939 Abnormal uterine and vaginal bleeding, unspecified: Secondary | ICD-10-CM

## 2015-04-10 NOTE — Telephone Encounter (Signed)
Patient is informed of test results.  She is told that [facility] is @ 85.4, Prolactin @ 5.9, TSH @ 2.74, and Estradiol @ 19.   She is informed that she is menopausal but still having irregular cycles and we need to do a PUS and possibly SHGM to evaluate the uterine lining.  Her Estradiol level is very low also consistent with menopausal range.  She is in agreement to proceed with work up.  She is also very interested in needing treatment for the symptoms of menopause with lo dose OCP or HRT.  She is having an increase in symptoms during the day.  Will place order and try to get procedure with Dr. Sabra Heck soon.

## 2015-04-11 ENCOUNTER — Telehealth: Payer: Self-pay | Admitting: Nurse Practitioner

## 2015-04-11 DIAGNOSIS — N939 Abnormal uterine and vaginal bleeding, unspecified: Secondary | ICD-10-CM

## 2015-04-11 NOTE — Telephone Encounter (Signed)
Spoke with pt regarding benefit for ultrasound. Patient understood and agreeable. Patient ready to schedule. Patient scheduled 04/18/15 with Dr Sabra Heck. Pt aware of arrival date and time. Pt aware of 72 hours cancellation policy with [id number] fee. No further questions. Ok to close

## 2015-04-12 ENCOUNTER — Ambulatory Visit: Payer: Commercial Managed Care - PPO

## 2015-04-14 ENCOUNTER — Encounter: Payer: Self-pay | Admitting: Obstetrics & Gynecology

## 2015-04-14 NOTE — Progress Notes (Signed)
Reviewed personally.  M. Suzanne Leonides Minder, MD.  

## 2015-04-18 ENCOUNTER — Ambulatory Visit (INDEPENDENT_AMBULATORY_CARE_PROVIDER_SITE_OTHER): Payer: Commercial Managed Care - PPO

## 2015-04-18 ENCOUNTER — Ambulatory Visit (INDEPENDENT_AMBULATORY_CARE_PROVIDER_SITE_OTHER): Payer: Commercial Managed Care - PPO | Admitting: Obstetrics & Gynecology

## 2015-04-18 VITALS — BP 90/70 | HR 76 | Resp 16 | Ht 64.5 in | Wt 128.0 lb

## 2015-04-18 DIAGNOSIS — N951 Menopausal and female climacteric states: Secondary | ICD-10-CM | POA: Diagnosis not present

## 2015-04-18 DIAGNOSIS — D252 Subserosal leiomyoma of uterus: Secondary | ICD-10-CM | POA: Diagnosis not present

## 2015-04-18 DIAGNOSIS — N939 Abnormal uterine and vaginal bleeding, unspecified: Secondary | ICD-10-CM | POA: Diagnosis not present

## 2015-04-18 DIAGNOSIS — E349 Endocrine disorder, unspecified: Secondary | ICD-10-CM

## 2015-04-18 DIAGNOSIS — N63 Unspecified lump in breast: Secondary | ICD-10-CM | POA: Diagnosis not present

## 2015-04-18 DIAGNOSIS — N6310 Unspecified lump in the right breast, unspecified quadrant: Secondary | ICD-10-CM

## 2015-04-18 NOTE — Progress Notes (Signed)
Patient is scheduled for Bilateral Breast Diagnostic Mammogram and R Breast Ultrasound at [facility] imaging on 04/22/15 at 1330 . Patient agreeable to time/date/location. Follow up appointment with Dr. Sabra Heck scheduled for 07/12/15

## 2015-04-18 NOTE — Addendum Note (Signed)
Addended by: Michele Mcalpine on: 04/18/2015 07:49 AM   Modules accepted: Orders

## 2015-04-18 NOTE — Progress Notes (Signed)
GYNECOLOGY  VISIT   HPI: 45 y.o. G0P0 Married Caucasian female with elevated FSH and irregular menstrual cycles here for PUS.  FSH was 85.  TSH and prolactin were normal.  Estradiol was low as well.  Pt was started on Loloestrin for cycle regulation.  She hasn't started this as she was advised to start after her next cycle.  With the [facility] elevated but still having bleeding, PUS was recommended.  Pt also has complaint today of a breast mass.  She has hx of breast excisional biopsy in 08/10/12.  This was benign.  She has an area that is below the scar that is new, round, and non tender.  Last MMG 04/06/14.  Denies skin changes, nipple discharge, trauma or bruising.  GYNECOLOGIC HISTORY: Patient's last menstrual period was 03/13/2015. Contraception: condoms  There are no active problems to display for this patient.   Past Medical History  Diagnosis Date  . 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 IBS (irritable bowel syndrome)     Past Surgical History  Procedure Laterality Date  . Oophorectomy Right 1998    benign tumor  . Ear examination under anesthesia  1994    left  . Breast biopsy Right 08/10/2012    Procedure: NEEDLE LOCALIZATION REMOVAL RIGHT BREAST CALCIFICATIONS;  Surgeon: Haywood Lasso, MD;  Location: [place];  Service: General;  Laterality: Right;  . [place]    pathology not in [place] - will do pap X 20 years    MEDS:  Reviewed in EPIC and UTD  ALLERGIES: Review of patient's allergies indicates no known allergies.  Family History  Problem Relation Age of Onset  . Hyperlipidemia Mother   . Hypertension Mother   . Diabetes Father   . Cancer Maternal Grandfather 100    esophageal    SH:  Non-smoker, married  Review of Systems  All other systems reviewed and are negative.   PHYSICAL EXAMINATION:    BP 90/70 mmHg  Pulse 76  Resp 16  Ht 5' 4.5" (1.638 m)  Wt 128 lb (58.06 kg)  BMI 21.64 kg/m2  LMP 03/13/2015     Physical Exam    Constitutional: She appears well-developed and well-nourished.  Neck: Normal range of motion. Neck supple. No tracheal deviation present. No thyromegaly present.  Cardiovascular: Normal rate and regular rhythm.   Pulmonary/Chest: Effort normal and breath sounds normal. Right breast exhibits mass. Right breast exhibits no inverted nipple, no nipple discharge, no skin change and no tenderness. Left breast exhibits no inverted nipple, no mass, no nipple discharge, no skin change and no tenderness. Breasts are symmetrical.    Lymphadenopathy:    She has no cervical adenopathy.   Ultrasound findings: UTERUS: 7.0 x 4.0 x 2.8cm with 1.4cm subserosal fibroid EMS: 6.26mm, trimlanimar ADNEXA: Left ovary: 2.9 x 2.0 x [id number] with normal follicular pattern       Right ovary: absent CUL DE SAC:  No free fluid  Discussion:  Images and findings reviewed with pt.    Assessment: Despite FSH of 85, pt is clearly not in full menopause due to PUS findings of normal follicles in left ovary Small breast mass on right Small subserosal fibroid  Plan: Diagnostic MMG will be scheduled I feel pt should start Loloestrin.  Risks and side effects reviewed.  Feel with findings on ultrasound, she should start her cycle within two weeks.  If she hasn't, she will call and let me know.  Can use provera  challenge then.  However, I don't think this will be needed.  Advised pt, I think she will do better if branded Loloestrin is expensive, that a 24/26 day active pill for her will be better.  If she gets a 21/7 substitution, she will call as well.  Advised pt amenorrhea with this pill is quite common. Follow-up 3 months.   ~25 minutes spent with patient >50% of time was in face to face discussion of above.

## 2015-04-20 ENCOUNTER — Encounter: Payer: Self-pay | Admitting: Obstetrics & Gynecology

## 2015-04-20 DIAGNOSIS — E349 Endocrine disorder, unspecified: Secondary | ICD-10-CM | POA: Insufficient documentation

## 2015-04-20 DIAGNOSIS — D252 Subserosal leiomyoma of uterus: Secondary | ICD-10-CM | POA: Insufficient documentation

## 2015-04-22 ENCOUNTER — Ambulatory Visit
Admission: RE | Admit: 2015-04-22 | Discharge: 2015-04-22 | Disposition: A | Discharge status: Home / Self Care | Payer: Commercial Managed Care - PPO | Source: Ambulatory Visit | Attending: Obstetrics & Gynecology | Admitting: Obstetrics & Gynecology

## 2015-04-22 DIAGNOSIS — N6310 Unspecified lump in the right breast, unspecified quadrant: Secondary | ICD-10-CM

## 2015-04-30 ENCOUNTER — Telehealth: Payer: Self-pay | Admitting: Nurse Practitioner

## 2015-04-30 NOTE — Telephone Encounter (Signed)
1. Patient called and said, "Is there a lower-cost alternative to Loestrin FE. The pharmacy is trying to charge me over $60.00 a monthly." Pharmacy on file is correct.  2. Patient also said, "I also have some questions about menopause. I think I am having some symptoms. This is all new to me."

## 2015-04-30 NOTE — Telephone Encounter (Signed)
Left message to call Syd Manges at [phone]. 

## 2015-05-01 NOTE — Telephone Encounter (Signed)
So does she know what's on her approved list of OCP?? We could try Mircette but again that may not be covered.

## 2015-05-01 NOTE — Telephone Encounter (Signed)
Spoke with patient. Patient states that when she went to pick up her rx for Loestrin Fe she was advised it would be $ 60. Patient is unable to afford this per month. Asking for alternative birth control options to reduce cost. Patient would also like to know how long she can expect to experience peri-menopausal symptoms. "I was having night sweats and hot flashes every day and all of a sudden I am not having them any more. Is there any way to predict this?" Advised there is unfortunately no way to predict how long these symptoms will occur as this varies for each patient. She is agreeable and verbalizes understanding.

## 2015-05-02 MED ORDER — DESOGESTREL-ETHINYL ESTRADIOL 0.15-0.02/0.01 MG (21/5) PO TABS: 1.0000 | ORAL_TABLET | Freq: Every day | ORAL | Status: DC

## 2015-05-02 NOTE — Telephone Encounter (Signed)
Spoke with patient. Advised of message as seen below from Kem Boroughs, [place]. Patient would like rx to be sent to her pharmacy at this time so she may verify cost. Rx for Mircette #3 1RF sent to pharmacy on file. If this medication will also be too costly she will contact her insurance company regarding which birth control medications are covered with her plan and return call to the office.  Routing to provider for final review. Patient agreeable to disposition. Will close encounter.

## 2015-05-02 NOTE — Telephone Encounter (Signed)
Left message to call Jaidee Stipe at [phone]. 

## 2015-05-10 ENCOUNTER — Ambulatory Visit: Payer: Commercial Managed Care - PPO

## 2015-06-14 ENCOUNTER — Telehealth: Payer: Self-pay | Admitting: Obstetrics & Gynecology

## 2015-06-14 NOTE — Telephone Encounter (Signed)
Left message on voicemail to call and reschedule cancelled appointment. °

## 2015-07-12 ENCOUNTER — Ambulatory Visit: Payer: Commercial Managed Care - PPO | Admitting: Obstetrics & Gynecology

## 2015-07-30 ENCOUNTER — Telehealth: Payer: Self-pay | Admitting: Nurse Practitioner

## 2015-07-30 NOTE — Telephone Encounter (Signed)
Spoke with pharmacy and they are refilling her Rx. Rx was sent 05/02/15 #3 Package 1R.

## 2015-07-30 NOTE — Telephone Encounter (Signed)
Patient calling requesting a refill on her birth control. Pharmacy on file is correct. °

## 2015-08-21 NOTE — Telephone Encounter (Signed)
Please close encounter if no follow up is needed.

## 2015-08-29 ENCOUNTER — Other Ambulatory Visit: Payer: Self-pay | Admitting: Internal Medicine

## 2015-08-29 DIAGNOSIS — K219 Gastro-esophageal reflux disease without esophagitis: Secondary | ICD-10-CM

## 2015-09-04 ENCOUNTER — Ambulatory Visit: Payer: Commercial Managed Care - PPO | Admitting: Nurse Practitioner

## 2015-09-13 ENCOUNTER — Ambulatory Visit
Admission: RE | Admit: 2015-09-13 | Discharge: 2015-09-13 | Disposition: A | Discharge status: Home / Self Care | Payer: Commercial Managed Care - PPO | Source: Ambulatory Visit | Attending: Internal Medicine | Admitting: Internal Medicine

## 2015-09-13 DIAGNOSIS — K219 Gastro-esophageal reflux disease without esophagitis: Secondary | ICD-10-CM

## 2015-09-20 ENCOUNTER — Ambulatory Visit (INDEPENDENT_AMBULATORY_CARE_PROVIDER_SITE_OTHER): Payer: Commercial Managed Care - PPO | Admitting: Nurse Practitioner

## 2015-09-20 ENCOUNTER — Encounter: Payer: Self-pay | Admitting: Nurse Practitioner

## 2015-09-20 VITALS — BP 112/76 | HR 76 | Ht 64.5 in | Wt 130.0 lb

## 2015-09-20 DIAGNOSIS — D252 Subserosal leiomyoma of uterus: Secondary | ICD-10-CM

## 2015-09-20 DIAGNOSIS — Z01419 Encounter for gynecological examination (general) (routine) without abnormal findings: Secondary | ICD-10-CM | POA: Diagnosis not present

## 2015-09-20 DIAGNOSIS — Z0000 Encounter for general adult medical examination without abnormal findings: Secondary | ICD-10-CM

## 2015-09-20 LAB — POCT URINALYSIS DIPSTICK
BILIRUBIN UA: NEGATIVE
GLUCOSE UA: NEGATIVE
KETONES UA: NEGATIVE
Leukocytes, UA: NEGATIVE
Nitrite, UA: NEGATIVE
PH UA: 5
Protein, UA: NEGATIVE
RBC UA: NEGATIVE
Urobilinogen, UA: NEGATIVE

## 2015-09-20 MED ORDER — DESOGESTREL-ETHINYL ESTRADIOL 0.15-0.02/0.01 MG (21/5) PO TABS: 1.0000 | ORAL_TABLET | Freq: Every day | ORAL | 4 refills | Status: DC

## 2015-09-20 NOTE — Progress Notes (Signed)
Patient ID: Elaine Edwards, female   DOB: 1970/07/17, 45 y.o.   MRN: [id number]  45 y.o. G0P0000 Married  Caucasian Fe here for annual exam.  Menses is monthly since on Mircette and cycle is 3 days.  Light to spotting.  No cramps.  Less vaso symptoms, less insomnia, and less emotional.  However she still is emotional about a 2 lb weight gain.  She wants to come off OCP and see how she does -may go back to irregular bleeding so cautioned her about that.  In addition she may get more vaso symptoms since her Sigel was already elevated to 85.4 on 04/08/15.  Her PUS done last year also showed uterine fibroid.  They are getting ready to move to a new home in 10 days.  She has already finished the painting and will be getting things out of storage.  Patient's last menstrual period was 09/17/2015 (exact date).          Sexually active: Yes.    The current method of family planning is OCP    Exercising: Yes.   Orange Theory (cardio and strength) 4 times per week Smoker:  no  Health Maintenance: Pap: 09/03/14, Negative with neg HR HPV MMG: 04/22/15, 3D with Right Ultrasound, Benign Cyst; Bi-Rads 2: Benign, screening in one year TDaP:  08/04/2011 HIV: done today Labs: HB: 13.8  Urine: Negative   reports that she has never smoked. She has never used smokeless tobacco. She reports that she does not drink alcohol or use drugs.  Past Medical History:  Diagnosis Date  . 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 IBS (irritable bowel syndrome)     Past Surgical History:  Procedure Laterality Date  . BREAST BIOPSY Right 08/10/2012   Procedure: NEEDLE LOCALIZATION REMOVAL RIGHT BREAST CALCIFICATIONS;  Surgeon: Haywood Lasso, MD;  Location: [place];  Service: General;  Laterality: Right;  . EAR EXAMINATION UNDER ANESTHESIA  1994   left  . LEEP  1998   pathology not in Epic - will do pap X 20 years  . OOPHORECTOMY Right 1998   benign tumor    Current Outpatient Prescriptions  Medication  Sig Dispense Refill  . desogestrel-ethinyl estradiol (MIRCETTE) 0.15-0.02/0.01 MG (21/5) tablet Take 1 tablet by mouth daily. 3 Package 1  . Multiple Vitamins-Minerals (MULTIVITAMIN WITH MINERALS) tablet Take 1 tablet by mouth daily.    . pantoprazole (PROTONIX) 40 MG tablet Take 1 tablet by mouth daily.  5   No current facility-administered medications for this visit.     Family History  Problem Relation Age of Onset  . Hyperlipidemia Mother   . Hypertension Mother   . Diabetes Father   . Cancer Maternal Grandfather 60    esophageal  . Stroke Maternal Grandmother     ROS:  Pertinent items are noted in HPI.  Otherwise, a comprehensive ROS was negative.  Exam:   BP 112/76 (BP Location: Right Arm, Patient Position: Sitting, Cuff Size: Normal)   Pulse 76   Ht 5' 4.5" (1.638 m)   Wt 130 lb (59 kg)   LMP 09/17/2015 (Exact Date)   BMI 21.97 kg/m  Height: 5' 4.5" (163.8 cm) Ht Readings from Last 3 Encounters:  09/20/15 5' 4.5" (1.638 m)  04/18/15 5' 4.5" (1.638 m)  09/03/14 5' 4.5" (1.638 m)    General appearance: alert, cooperative and appears stated age Head: Normocephalic, without obvious abnormality, atraumatic Neck: no adenopathy, supple, symmetrical, trachea midline and thyroid normal to  inspection and palpation Lungs: clear to auscultation bilaterally Breasts: normal appearance, no masses or tenderness Heart: regular rate and rhythm Abdomen: soft, non-tender; no masses,  no organomegaly Extremities: extremities normal, atraumatic, no cyanosis or edema Skin: Skin color, texture, turgor normal. No rashes or lesions Lymph nodes: Cervical, supraclavicular, and axillary nodes normal. No abnormal inguinal nodes palpated Neurologic: Grossly normal   Pelvic: External genitalia:  no lesions              Urethra:  normal appearing urethra with no masses, tenderness or lesions              Bartholin's and Skene's: normal                 Vagina: normal appearing vagina with  normal color and discharge, no lesions              Cervix: anteverted              Pap taken: Yes.   Bimanual Exam:  Uterus:  normal size, contour, position, consistency, mobility, non-tender              Adnexa: no mass, fullness, tenderness               Rectovaginal: Confirms               Anus:  normal sphincter tone, no lesions  Chaperone present: yes  A:  Well Woman with normal exam  LEEP 1998.   right breast cyst.  Perimenopausal symptoms   History of LEEP 11/1996 no pathology available - pap for 20 years   P:   Reviewed health and wellness pertinent to exam  Pap smear as above  Mammogram is due 02/2016  Refill Mircette for a year  Follow with labs  Counseled on breast self exam, mammography screening, use and side effects of OCP's, adequate intake of calcium and vitamin D, diet and exercise return annually or prn  An After Visit Summary was printed and given to the patient.

## 2015-09-20 NOTE — Patient Instructions (Signed)

## 2015-09-21 LAB — HIV ANTIBODY (ROUTINE TESTING W REFLEX): HIV 1&2 Ab, 4th Generation: NONREACTIVE

## 2015-09-22 NOTE — Progress Notes (Signed)
Encounter reviewed by Dr. Walfred Bettendorf Amundson C. Silva.  

## 2015-09-23 LAB — HEMOGLOBIN, FINGERSTICK: Hemoglobin, fingerstick: 13.8 g/dL (ref 12.0–16.0)

## 2015-09-24 LAB — IPS PAP TEST WITH HPV

## 2016-04-09 ENCOUNTER — Other Ambulatory Visit: Payer: Self-pay | Admitting: Nurse Practitioner

## 2016-04-09 DIAGNOSIS — Z1231 Encounter for screening mammogram for malignant neoplasm of breast: Secondary | ICD-10-CM

## 2016-05-08 ENCOUNTER — Ambulatory Visit
Admission: RE | Admit: 2016-05-08 | Discharge: 2016-05-08 | Disposition: A | Discharge status: Home / Self Care | Payer: Commercial Managed Care - PPO | Source: Ambulatory Visit | Attending: Nurse Practitioner | Admitting: Nurse Practitioner

## 2016-05-08 DIAGNOSIS — Z1231 Encounter for screening mammogram for malignant neoplasm of breast: Secondary | ICD-10-CM

## 2016-07-21 ENCOUNTER — Telehealth: Payer: Self-pay | Admitting: Certified Nurse Midwife

## 2016-07-21 NOTE — Telephone Encounter (Signed)
LMTCB/:NP/ .CX/LETTER SENT/RD

## 2016-09-25 ENCOUNTER — Ambulatory Visit: Payer: Commercial Managed Care - PPO | Admitting: Nurse Practitioner

## 2016-11-09 ENCOUNTER — Other Ambulatory Visit: Payer: Self-pay | Admitting: *Deleted

## 2016-11-09 MED ORDER — DESOGESTREL-ETHINYL ESTRADIOL 0.15-0.02/0.01 MG (21/5) PO TABS: 1.0000 | ORAL_TABLET | Freq: Every day | ORAL | 0 refills | Status: DC

## 2016-11-09 NOTE — Telephone Encounter (Signed)
Medication refill request: OCP  Last AEX:  09-20-15  Next AEX: 11-40-18 Last MMG (if hormonal medication request): 05-08-16 WNL  Refill authorized: please advise

## 2016-12-04 ENCOUNTER — Ambulatory Visit: Payer: Commercial Managed Care - PPO | Admitting: Obstetrics & Gynecology

## 2017-01-30 ENCOUNTER — Other Ambulatory Visit: Payer: Self-pay | Admitting: Obstetrics & Gynecology

## 2017-02-01 NOTE — Telephone Encounter (Signed)
Medication refill request: OCP Last AEX:  09/20/15 PG  Next AEX: 04/30/17  Last MMG (if hormonal medication request): 05/08/16 BIRADS 1 negative  Refill authorized: 11/09/16 #3package, 0RF. Today, please advise.

## 2017-03-24 ENCOUNTER — Telehealth: Payer: Self-pay | Admitting: Obstetrics & Gynecology

## 2017-03-24 NOTE — Telephone Encounter (Signed)
Left patient a message to call back to reschedule a future appointment that was cancelled by the provider for an AEX with Dr. Sabra Heck.

## 2017-03-30 ENCOUNTER — Other Ambulatory Visit: Payer: Self-pay | Admitting: Obstetrics & Gynecology

## 2017-03-30 DIAGNOSIS — Z139 Encounter for screening, unspecified: Secondary | ICD-10-CM

## 2017-04-26 ENCOUNTER — Other Ambulatory Visit: Payer: Self-pay | Admitting: Obstetrics & Gynecology

## 2017-04-26 NOTE — Telephone Encounter (Signed)
Medication refill request: [place]  Last AEX:  09/20/15 PG  Next AEX: 06/01/17 SM  Last MMG (if hormonal medication request): 05/08/16 BIRADS 1 negative  Refill authorized: 02/01/17 3packs, 0 RF. Today, please advise.   Routing to covering provider.

## 2017-04-30 ENCOUNTER — Ambulatory Visit: Payer: Commercial Managed Care - PPO | Admitting: Obstetrics & Gynecology

## 2017-05-14 ENCOUNTER — Ambulatory Visit (INDEPENDENT_AMBULATORY_CARE_PROVIDER_SITE_OTHER): Payer: Commercial Managed Care - PPO | Admitting: Obstetrics & Gynecology

## 2017-05-14 ENCOUNTER — Other Ambulatory Visit: Payer: Self-pay

## 2017-05-14 ENCOUNTER — Encounter: Payer: Self-pay | Admitting: Obstetrics & Gynecology

## 2017-05-14 ENCOUNTER — Ambulatory Visit: Payer: Commercial Managed Care - PPO

## 2017-05-14 VITALS — BP 110/80 | HR 72 | Resp 16 | Ht 64.5 in | Wt 128.6 lb

## 2017-05-14 DIAGNOSIS — Z0000 Encounter for general adult medical examination without abnormal findings: Secondary | ICD-10-CM

## 2017-05-14 DIAGNOSIS — Z1211 Encounter for screening for malignant neoplasm of colon: Secondary | ICD-10-CM | POA: Diagnosis not present

## 2017-05-14 DIAGNOSIS — N951 Menopausal and female climacteric states: Secondary | ICD-10-CM

## 2017-05-14 DIAGNOSIS — Z01419 Encounter for gynecological examination (general) (routine) without abnormal findings: Secondary | ICD-10-CM | POA: Diagnosis not present

## 2017-05-14 MED ORDER — DESOGESTREL-ETHINYL ESTRADIOL 0.15-0.02/0.01 MG (21/5) PO TABS: 1.0000 | ORAL_TABLET | Freq: Every day | ORAL | 4 refills | Status: DC

## 2017-05-14 NOTE — Progress Notes (Signed)
47 y.o. G0P0000 MarriedCaucasianF here for annual exam.  On OCPs now and cycles are very short.  Lasts less than a day and a half.  She is having perimenopausal symptoms.  Hasn't had blood work in several years.  PCP:  Dr. Virgina Jock.    Patient's last menstrual period was 05/05/2017 (approximate).          Sexually active: Yes.    The current method of family planning is OCP (estrogen/progesterone).    Exercising: Yes.    cardio and strength 4 x weekly  Smoker:  no  Health Maintenance: Pap:  09/20/15 Neg. HR HPV:neg   09/03/14 Neg. HR HPV:Neg  History of abnormal Pap:  Yes, LEEP 1998 MMG:  05/08/16 BIRADS1:Neg. Has appt 06/18/17  Colonoscopy:  never BMD:   never TDaP:  2013 Screening Labs: if needed   reports that she has never smoked. She has never used smokeless tobacco. She reports that she does not drink alcohol or use drugs.  Past Medical History:  Diagnosis Date  . 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 IBS (irritable bowel syndrome)     Past Surgical History:  Procedure Laterality Date  . BREAST BIOPSY Right 08/10/2012   Procedure: NEEDLE LOCALIZATION REMOVAL RIGHT BREAST CALCIFICATIONS;  Surgeon: Haywood Lasso, MD;  Location: [facility];  Service: General;  Laterality: Right;  . BREAST EXCISIONAL BIOPSY Right 2014   Benign   . EAR EXAMINATION UNDER ANESTHESIA  1994   left  . LEEP  1998   pathology not in Epic - will do pap X 20 years  . OOPHORECTOMY Right 1998   benign tumor    Current Outpatient Medications  Medication Sig Dispense Refill  . Multiple Vitamins-Minerals (MULTIVITAMIN WITH MINERALS) tablet Take 1 tablet by mouth daily.    . pantoprazole (PROTONIX) 40 MG tablet Take 1 tablet by mouth daily.  5  . VIORELE 0.15-0.02/0.01 MG (21/5) tablet TAKE 1 TABLET BY MOUTH DAILY 84 tablet 0   No current facility-administered medications for this visit.     Family History  Problem Relation Age of Onset  . Hyperlipidemia Mother   . Hypertension Mother    . Diabetes Father   . Cancer Maternal Grandfather 60       esophageal  . Stroke Maternal Grandmother   . Breast cancer Neg Hx     Review of Systems  All other systems reviewed and are negative.   Exam:   BP 110/80 (BP Location: Right Arm, Patient Position: Sitting, Cuff Size: Normal)   Pulse 72   Resp 16   Ht 5' 4.5" (1.638 m)   Wt 128 lb 9.6 oz (58.3 kg)   LMP 05/05/2017 (Approximate)   BMI 21.73 kg/m    Height: 5' 4.5" (163.8 cm)  Ht Readings from Last 3 Encounters:  05/14/17 5' 4.5" (1.638 m)  09/20/15 5' 4.5" (1.638 m)  04/18/15 5' 4.5" (1.638 m)    General appearance: alert, cooperative and appears stated age Head: Normocephalic, without obvious abnormality, atraumatic Neck: no adenopathy, supple, symmetrical, trachea midline and thyroid normal to inspection and palpation Lungs: clear to auscultation bilaterally Breasts: normal appearance, no masses or tenderness Heart: regular rate and rhythm Abdomen: soft, non-tender; bowel sounds normal; no masses,  no organomegaly Extremities: extremities normal, atraumatic, no cyanosis or edema Skin: Skin color, texture, turgor normal. No rashes or lesions Lymph nodes: Cervical, supraclavicular, and axillary nodes normal. No abnormal inguinal nodes palpated Neurologic: Grossly normal   Pelvic: External genitalia:  no lesions              Urethra:  normal appearing urethra with no masses, tenderness or lesions              Bartholins and Skenes: normal                 Vagina: normal appearing vagina with normal color and discharge, no lesions              Cervix: no lesions              Pap taken: No. Bimanual Exam:  Uterus:  normal size, contour, position, consistency, mobility, non-tender              Adnexa: normal adnexa and no mass, fullness, tenderness               Rectovaginal: Confirms               Anus:  normal sphincter tone, no lesions  Chaperone was present for exam.  A:  Well Woman with normal  exam Perimenopausal LEEP 1998.  Neg pap with neg HR HPV 2017.  Not indicated today. Grade C breast density  P:   Mammogram guidelines reviewed.  Has this scheduled.  She is going to schedule a 3D. pap smear not obtained today AMH obtained today Ifob ordered CBC, CMP, Lipids, TSH, Vit D obtained today as well return annually or prn

## 2017-05-20 LAB — CBC
Hematocrit: 44 % (ref 34.0–46.6)
Hemoglobin: 14.1 g/dL (ref 11.1–15.9)
MCH: 28.6 pg (ref 26.6–33.0)
MCHC: 32 g/dL (ref 31.5–35.7)
MCV: 89 fL (ref 79–97)
Platelets: 439 10*3/uL — ABNORMAL HIGH (ref 150–379)
RBC: 4.93 x10E6/uL (ref 3.77–5.28)
RDW: 14.8 % (ref 12.3–15.4)
WBC: 6.3 10*3/uL (ref 3.4–10.8)

## 2017-05-20 LAB — LIPID PANEL
CHOL/HDL RATIO: 2.8 ratio (ref 0.0–4.4)
Cholesterol, Total: 185 mg/dL (ref 100–199)
HDL: 67 mg/dL (ref >39)
LDL CALC: 99 mg/dL (ref 0–99)
Triglycerides: 93 mg/dL (ref 0–149)
VLDL CHOLESTEROL CAL: 19 mg/dL (ref 5–40)

## 2017-05-20 LAB — COMPREHENSIVE METABOLIC PANEL
ALK PHOS: 72 IU/L (ref 39–117)
ALT: 16 IU/L (ref 0–32)
AST: 28 IU/L (ref 0–40)
Albumin/Globulin Ratio: 1.6 (ref 1.2–2.2)
Albumin: 4.5 g/dL (ref 3.5–5.5)
BILIRUBIN TOTAL: 0.4 mg/dL (ref 0.0–1.2)
BUN/Creatinine Ratio: 15 (ref 9–23)
BUN: 16 mg/dL (ref 6–24)
CO2: 23 mmol/L (ref 20–29)
CREATININE: 1.05 mg/dL — AB (ref 0.57–1.00)
Calcium: 9.6 mg/dL (ref 8.7–10.2)
Chloride: 103 mmol/L (ref 96–106)
GFR calc Af Amer: 74 mL/min/{1.73_m2} (ref >59)
GFR calc non Af Amer: 64 mL/min/{1.73_m2} (ref >59)
GLOBULIN, TOTAL: 2.9 g/dL (ref 1.5–4.5)
GLUCOSE: 80 mg/dL (ref 65–99)
Potassium: 4.3 mmol/L (ref 3.5–5.2)
Sodium: 142 mmol/L (ref 134–144)
Total Protein: 7.4 g/dL (ref 6.0–8.5)

## 2017-05-20 LAB — TSH: TSH: 3.71 u[IU]/mL (ref 0.450–4.500)

## 2017-05-20 LAB — ANTI MULLERIAN HORMONE

## 2017-05-20 LAB — VITAMIN D 25 HYDROXY (VIT D DEFICIENCY, FRACTURES): VIT D 25 HYDROXY: 85.3 ng/mL (ref 30.0–100.0)

## 2017-06-01 ENCOUNTER — Ambulatory Visit: Payer: Commercial Managed Care - PPO | Admitting: Obstetrics & Gynecology

## 2017-06-15 ENCOUNTER — Telehealth: Payer: Self-pay | Admitting: Obstetrics & Gynecology

## 2017-06-15 NOTE — Telephone Encounter (Signed)
OTC estroven or black cohosh can be taken for hot flashes and night sweats.  May need to move up having AMH test done if this does not help.  Thanks.

## 2017-06-15 NOTE — Telephone Encounter (Signed)
Message left to return call to Triage Nurse at [phone].   Please route to Triage if I am unavailable.    

## 2017-06-15 NOTE — Telephone Encounter (Signed)
Spoke with patient. Patient stopped OCP at the end of May. First day off was 6/1. Reports she is now having night sweats and hot flashes every day. Never had symptoms while on OCP. Patient is returning in August to have [place] rechecked. Asking what she can do for relief in the interim as symptoms are interfering with her daily activities. Advised will review with Dr.Miller and return call.

## 2017-06-15 NOTE — Telephone Encounter (Signed)
Patient is having some night sweats after decreasing estrogen. Patient would like to talk with a nurse about possible solutions.

## 2017-06-16 NOTE — Telephone Encounter (Signed)
Spoke with patient. Advised of message as seen below from Dr.Miller. Patient verbalizes understanding. Encounter closed. 

## 2017-06-18 ENCOUNTER — Ambulatory Visit: Payer: Commercial Managed Care - PPO

## 2017-07-06 ENCOUNTER — Telehealth: Payer: Self-pay | Admitting: Obstetrics & Gynecology

## 2017-07-06 ENCOUNTER — Ambulatory Visit: Payer: Commercial Managed Care - PPO | Admitting: Obstetrics & Gynecology

## 2017-07-06 ENCOUNTER — Encounter: Payer: Self-pay | Admitting: Obstetrics & Gynecology

## 2017-07-06 ENCOUNTER — Other Ambulatory Visit: Payer: Self-pay

## 2017-07-06 VITALS — BP 110/72 | HR 72 | Resp 14 | Ht 64.5 in | Wt 129.5 lb

## 2017-07-06 DIAGNOSIS — N912 Amenorrhea, unspecified: Secondary | ICD-10-CM

## 2017-07-06 MED ORDER — ESTRADIOL 1 MG PO TABS: 1.0000 mg | ORAL_TABLET | Freq: Every day | ORAL | 0 refills | Status: DC

## 2017-07-06 MED ORDER — PROGESTERONE MICRONIZED 200 MG PO CAPS
ORAL_CAPSULE | ORAL | 0 refills | Status: DC
Start: 2017-07-06 — End: 2017-09-21

## 2017-07-06 NOTE — Telephone Encounter (Signed)
Spoke with patient.   1. Providing update. Tried OTC estroven for hot flashes & night sweats, no relief. Requesting to come in for [employer].  2. Constipation for 8wks. Has been taking Align daily for 6wks, no changes. Denies pain or blood in stool. Has also tried Mirilax.   3. Right breast lump with pain. States cyst has been present before, now larger and with pain. Hurts to touch. Denies skin changes or nipple d/c.   OV scheduled for today at 1pm with Dr. Sabra Heck. Patient verbalizes understanding and is agreeable. Encounter closed.

## 2017-07-06 NOTE — Patient Instructions (Signed)
Colace 100mg  twice daily.   You can take this twice weekly.

## 2017-07-06 NOTE — Telephone Encounter (Signed)
Patient would like a call from nurse so she can give an update on how she is doing.

## 2017-07-06 NOTE — Progress Notes (Signed)
GYNECOLOGY  VISIT  CC:   Hot flashes, night sweats  HPI: 47 y.o. G0P0000 Married Caucasian female here for three issues that she'd like to discuss.  First, at her AEX, and AMH level was obtained that was undetectable.  She was advised to stop her OCPs.  Since that times, she experienced hot flashes, night sweats, and some mild sleep disturbance.  Wants to know why she can't stay on the OCPs since she was not having these issues while on the OCPs.  D/w pt dosing in OCPs vs HRT.  As she is symptomatic, feel HRT is appropriate for pt.  Reviewed dosing.  Discussed with patient risks and benefits and specifically the WHI study including but not limited to risks of increased risks of heart disease, MI, stroke, DVT, and breast cancer.  Possibility of bleeding was discussed as patient does have a uterus.    Also has noted increased constipation that does seem to be related to her hormonal changes.  Feels uncomfortable and bloated at times.  Has tried Miralax and taken Align but this didn't help.  Didn't take the Miralax for any amount of time and did not use it with regularity.    Lastly, she's noted a right breast cyst over the past several weeks.  Reports she's had cysts previously and this feel similar except that it is tender.  Desires recommendations.    GYNECOLOGIC HISTORY: Patient's last menstrual period was 05/31/2017. Contraception:  OCP Menopausal hormone therapy: none  Patient Active Problem List   Diagnosis Date Noted  . 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 Patella-femoral syndrome 11/13/2014    Past Medical History:  Diagnosis Date  . 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 IBS (irritable bowel syndrome)     Past Surgical History:  Procedure Laterality Date  . BREAST BIOPSY Right 08/10/2012   Procedure: NEEDLE LOCALIZATION REMOVAL RIGHT BREAST CALCIFICATIONS;  Surgeon: Haywood Lasso, MD;  Location: [facility];  Service: General;  Laterality: Right;  . BREAST  EXCISIONAL BIOPSY Right 2014   Benign   . EAR EXAMINATION UNDER ANESTHESIA  1994   left  . LEEP  1998   pathology not in Epic - will do pap X 20 years  . OOPHORECTOMY Right 1998   benign tumor    MEDS:   Current Outpatient Medications on File Prior to Visit  Medication Sig Dispense Refill  . Multiple Vitamins-Minerals (MULTIVITAMIN WITH MINERALS) tablet Take 1 tablet by mouth daily.    . pantoprazole (PROTONIX) 40 MG tablet Take 1 tablet by mouth daily.  5   No current facility-administered medications on file prior to visit.     ALLERGIES: Patient has no known allergies.  Family History  Problem Relation Age of Onset  . Hyperlipidemia Mother   . Hypertension Mother   . Diabetes Father   . Cancer Maternal Grandfather 60       esophageal  . Stroke Maternal Grandmother   . Breast cancer Neg Hx     SH:  Married, non smoker  Review of Systems  Gastrointestinal: Positive for constipation.  Genitourinary:       Right breast lump  Endo/Heme/Allergies:       Hot flashes Night sweats  All other systems reviewed and are negative.   PHYSICAL EXAMINATION:    BP 110/72 (BP Location: Right Arm, Patient Position: Sitting, Cuff Size: Normal)   Pulse 72   Resp 14   Ht 5' 4.5" (1.638 m)   Wt  129 lb 8 oz (58.7 kg)   LMP 05/31/2017   BMI 21.89 kg/m     Physical Exam  Constitutional: She appears well-developed and well-nourished.  Respiratory: Right breast exhibits mass and tenderness. Right breast exhibits no inverted nipple, no nipple discharge and no skin change. Left breast exhibits no inverted nipple, no mass, no nipple discharge, no skin change and no tenderness. Breasts are symmetrical.    Lymphadenopathy:    She has no axillary adenopathy.    Assessment: Vasomotor symptoms Constipation Right breast nodule, most consistent with cyst  Plan: D/w pt starting HRT due to symptoms.  Dosing reviewed.  Will start with 1mg  estradiol daily.  Will also use Prometrium 200mg   days 1-15 each month.  Pt knows to expect cyclical bleeding with this dosing. She will start Colace 100mg  BID and start Miralax every two ot three days after she has been on the Colace over a week.  May need GI evaluation and treatment with Linzess.  Does drink adequate water and exercises regularly. Pt will monitor the breast cyst and if does not resolve over next four weeks, will let me know and diagnostic imaging will be ordered. AMH ordered today. Follow up 3 months.  ~15 minutes spent with patient >50% of time was in face to face discussion of above.

## 2017-07-07 ENCOUNTER — Telehealth: Payer: Self-pay | Admitting: Obstetrics & Gynecology

## 2017-07-07 ENCOUNTER — Other Ambulatory Visit: Payer: Self-pay | Admitting: Obstetrics & Gynecology

## 2017-07-07 DIAGNOSIS — N6310 Unspecified lump in the right breast, unspecified quadrant: Secondary | ICD-10-CM

## 2017-07-07 NOTE — Telephone Encounter (Signed)
I would go ahead and schedule her for diagnostic imaging on the right breast.  Her last MMG was 5/18 so she can be scheduled for diagnostic bilateral MMG just to complete imaging on the left side as well.

## 2017-07-07 NOTE — Telephone Encounter (Signed)
Patient called requesting to speak with the nurse. She was seen yesterday for a problem with her breast. Patient is calling this morning to report changes with her breast since yesterday's visit.

## 2017-07-07 NOTE — Telephone Encounter (Signed)
Spoke with Judson Roch at [facility]. Patient scheduled for bilateral diagnostic MMG for right breast nodule on Friday 07-09-17 at 0800. Will send order for co-sign.

## 2017-07-07 NOTE — Telephone Encounter (Signed)
Call to patient. Patient states she was just seen yesterday by Dr. Sabra Heck and was evaluated for breast lump. Patient states Dr. Sabra Heck seemed to think it was a cyst and told me to call back in a week if the area did not improve. Patient states that last night and this morning the "wall of the cyst" is larger and a lot more tender. Patient states she wanted to touch base before the holiday and see if Dr. Sabra Heck wanted her to wait the full week or if she needed imaging as they had discussed. Patient asking if there is another way besides a MMG to image the area as she doesn't want to cause anymore discomfort. RN advised would review with covering provider and return call. Patient agreeable.   Routing to covering provider, Dr. Quincy Simmonds for review.   Cc Dr. Sabra Heck

## 2017-07-07 NOTE — Telephone Encounter (Signed)
Call to patient. Appointment time of Friday 07-09-17 at 0800 with 0740 arrival provided for patient and she is agreeable to date and time of appointment.   Routing to provider for final review. Patient agreeable to disposition. Will close encounter.

## 2017-07-07 NOTE — Telephone Encounter (Signed)
Call to patient. Message given to patient as seen below from Dr. Sabra Heck. Patient agreeable. Advised patient will call [facility] to schedule and return call to patient.

## 2017-07-09 ENCOUNTER — Other Ambulatory Visit: Payer: Self-pay | Admitting: Obstetrics & Gynecology

## 2017-07-09 ENCOUNTER — Ambulatory Visit
Admission: RE | Admit: 2017-07-09 | Discharge: 2017-07-09 | Disposition: A | Discharge status: Home / Self Care | Payer: Commercial Managed Care - PPO | Source: Ambulatory Visit | Attending: Obstetrics & Gynecology | Admitting: Obstetrics & Gynecology

## 2017-07-09 DIAGNOSIS — N6310 Unspecified lump in the right breast, unspecified quadrant: Secondary | ICD-10-CM

## 2017-07-10 LAB — ANTI MULLERIAN HORMONE: ANTI-MULLERIAN HORMONE (AMH): 0.018 ng/mL

## 2017-07-12 ENCOUNTER — Other Ambulatory Visit: Payer: Self-pay | Admitting: Obstetrics & Gynecology

## 2017-07-27 ENCOUNTER — Encounter (HOSPITAL_BASED_OUTPATIENT_CLINIC_OR_DEPARTMENT_OTHER): Payer: Self-pay | Admitting: Emergency Medicine

## 2017-07-27 ENCOUNTER — Other Ambulatory Visit: Payer: Self-pay

## 2017-07-27 ENCOUNTER — Other Ambulatory Visit: Payer: Commercial Managed Care - PPO

## 2017-07-27 ENCOUNTER — Emergency Department (HOSPITAL_BASED_OUTPATIENT_CLINIC_OR_DEPARTMENT_OTHER)
Admission: EM | Admit: 2017-07-27 | Discharge: 2017-07-27 | Disposition: A | Discharge status: Home / Self Care | Payer: Commercial Managed Care - PPO | Attending: Emergency Medicine | Admitting: Emergency Medicine

## 2017-07-27 DIAGNOSIS — Z79899 Other long term (current) drug therapy: Secondary | ICD-10-CM | POA: Insufficient documentation

## 2017-07-27 DIAGNOSIS — K529 Noninfective gastroenteritis and colitis, unspecified: Secondary | ICD-10-CM

## 2017-07-27 DIAGNOSIS — K5289 Other specified noninfective gastroenteritis and colitis: Secondary | ICD-10-CM | POA: Diagnosis not present

## 2017-07-27 DIAGNOSIS — R112 Nausea with vomiting, unspecified: Secondary | ICD-10-CM | POA: Diagnosis present

## 2017-07-27 LAB — CBC WITH DIFFERENTIAL/PLATELET
BLASTS: 0 %
Band Neutrophils: 47 %
Basophils Absolute: 0 10*3/uL (ref 0.0–0.1)
Basophils Relative: 0 %
Eosinophils Absolute: 0 10*3/uL (ref 0.0–0.7)
Eosinophils Relative: 0 %
HEMATOCRIT: 39.2 % (ref 36.0–46.0)
HEMOGLOBIN: 13.7 g/dL (ref 12.0–15.0)
LYMPHS PCT: 6 %
Lymphs Abs: 0.4 10*3/uL — ABNORMAL LOW (ref 0.7–4.0)
MCH: 28.5 pg (ref 26.0–34.0)
MCHC: 34.9 g/dL (ref 30.0–36.0)
MCV: 81.5 fL (ref 78.0–100.0)
MONOS PCT: 7 %
Metamyelocytes Relative: 0 %
Monocytes Absolute: 0.4 10*3/uL (ref 0.1–1.0)
Myelocytes: 0 %
NEUTROS ABS: 5.4 10*3/uL (ref 1.7–7.7)
NEUTROS PCT: 40 %
OTHER: 0 %
Platelets: 276 10*3/uL (ref 150–400)
Promyelocytes Relative: 0 %
RBC: 4.81 MIL/uL (ref 3.87–5.11)
RDW: 13.8 % (ref 11.5–15.5)
WBC Morphology: INCREASED
WBC: 6.2 10*3/uL (ref 4.0–10.5)
nRBC: 0 /100 WBC

## 2017-07-27 LAB — COMPREHENSIVE METABOLIC PANEL
ALBUMIN: 3.4 g/dL — AB (ref 3.5–5.0)
ALK PHOS: 79 U/L (ref 38–126)
ALT: 44 U/L (ref 0–44)
ANION GAP: 10 (ref 5–15)
AST: 59 U/L — ABNORMAL HIGH (ref 15–41)
BILIRUBIN TOTAL: 0.8 mg/dL (ref 0.3–1.2)
BUN: 17 mg/dL (ref 6–20)
CALCIUM: 8.4 mg/dL — AB (ref 8.9–10.3)
CO2: 26 mmol/L (ref 22–32)
Chloride: 100 mmol/L (ref 98–111)
Creatinine, Ser: 1.04 mg/dL — ABNORMAL HIGH (ref 0.44–1.00)
GFR calc non Af Amer: >60 mL/min (ref >60)
GLUCOSE: 93 mg/dL (ref 70–99)
Potassium: 3.4 mmol/L — ABNORMAL LOW (ref 3.5–5.1)
Sodium: 136 mmol/L (ref 135–145)
TOTAL PROTEIN: 6.9 g/dL (ref 6.5–8.1)

## 2017-07-27 LAB — LIPASE, BLOOD: Lipase: 26 U/L (ref 11–51)

## 2017-07-27 MED ORDER — ONDANSETRON 8 MG PO TBDP: 8.0000 mg | ORAL_TABLET | Freq: Three times a day (TID) | ORAL | 0 refills | Status: DC | PRN

## 2017-07-27 MED ORDER — SODIUM CHLORIDE 0.9 % IV BOLUS
1000.0000 mL | Freq: Once | INTRAVENOUS | Status: AC
  Administered 2017-07-27: 1000 mL via INTRAVENOUS

## 2017-07-27 MED ORDER — ONDANSETRON 8 MG PO TBDP: 8.0000 mg | ORAL_TABLET | Freq: Once | ORAL | Status: DC

## 2017-07-27 MED ORDER — ONDANSETRON HCL 4 MG/2ML IJ SOLN
INTRAMUSCULAR | Status: AC
  Filled 2017-07-27: qty 2

## 2017-07-27 MED ORDER — ONDANSETRON HCL 4 MG/2ML IJ SOLN
4.0000 mg | Freq: Once | INTRAMUSCULAR | Status: AC
  Administered 2017-07-27: 4 mg via INTRAVENOUS

## 2017-07-27 MED ORDER — LACTATED RINGERS IV BOLUS
1000.0000 mL | Freq: Once | INTRAVENOUS | Status: AC
  Administered 2017-07-27: 1000 mL via INTRAVENOUS

## 2017-07-27 MED FILL — ONDANSETRON ODT 8 MG TABLET: 8 | 7 days supply | Qty: 20 | Fill #0

## 2017-07-27 NOTE — ED Triage Notes (Signed)
Pt c/o nausea, vomiting and diarrhea since Sunday, with fever and chills, pt denies any pain at this time.

## 2017-07-27 NOTE — ED Provider Notes (Signed)
[facility] EMERGENCY DEPARTMENT Provider Note   CSN: [id number] Arrival date & time: 07/27/17  0109     History   Chief Complaint Chief Complaint  Patient presents with  . Diarrhea    HPI Elaine Edwards is a 47 y.o. female.  HPI  Patient presents today with nausea, vomiting, diarrhea.  This began on Sunday afternoon shortly after eating lunch.  She had a fever to 102 at that time.  She has checked, and all the people she ate lunch with have been feeling fine.  She also endorses eating [place] the night prior, but her husband also ate this and feels fine.  She initially had vomiting and chills on Sunday afternoon, and felt lightheaded.  Yesterday morning she began having very frequent diarrhea multiple times per hour.  No blood in either vomit or stools.  No other sick contacts, no children at home.  She denies abdominal pain at rest, however feels cramping immediately prior to diarrhea.  Never had anything like this before.  No chronic medical problems other than early menopause for which she takes estrogen and progesterone from her GYN.  She denies any cough, rhinorrhea, sore throat.  Also denies dysuria.  She says she really has not urinated much over the past 36 hours.  Was able to eat some saltines yesterday, however has minimal appetite today.  Past Medical History:  Diagnosis Date  . 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 IBS (irritable bowel syndrome)     Patient Active Problem List   Diagnosis Date Noted  . 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 Patella-femoral syndrome 11/13/2014    Past Surgical History:  Procedure Laterality Date  . BREAST BIOPSY Right 08/10/2012   Procedure: NEEDLE LOCALIZATION REMOVAL RIGHT BREAST CALCIFICATIONS;  Surgeon: Haywood Lasso, MD;  Location: [facility];  Service: General;  Laterality: Right;  . BREAST EXCISIONAL BIOPSY Right 2014   Benign   . EAR EXAMINATION UNDER ANESTHESIA  1994   left  . LEEP   1998   pathology not in Epic - will do pap X 20 years  . OOPHORECTOMY Right 1998   benign tumor     OB History    Gravida  0   Para  0   Term  0   Preterm  0   AB  0   Living  0     SAB  0   TAB  0   Ectopic  0   Multiple  0   Live Births  0            Home Medications    Prior to Admission medications   Medication Sig Start Date End Date Taking? Authorizing Provider  estradiol (ESTRACE) 1 MG tablet Take 1 tablet (1 mg total) by mouth daily. 07/06/17   Megan Salon, MD  Multiple Vitamins-Minerals (MULTIVITAMIN WITH MINERALS) tablet Take 1 tablet by mouth daily.    [provider]  ondansetron (ZOFRAN ODT) 8 MG disintegrating tablet Take 1 tablet (8 mg total) by mouth every 8 (eight) hours as needed for nausea or vomiting. 07/27/17   Sela Hilding, MD  pantoprazole (PROTONIX) 40 MG tablet Take 1 tablet by mouth daily. 09/06/15   [provider]  progesterone (PROMETRIUM) 200 MG capsule Take 1 tablet nightly days 1-15 each months. 07/06/17   Megan Salon, MD    Family History Family History  Problem Relation Age of Onset  . Hyperlipidemia Mother   .  Hypertension Mother   . Diabetes Father   . Cancer Maternal Grandfather 60       esophageal  . Stroke Maternal Grandmother   . Breast cancer Neg Hx     Social History Social History   Tobacco Use  . Smoking status: Never Smoker  . Smokeless tobacco: Never Used  Substance Use Topics  . Alcohol use: No  . Drug use: No     Allergies   Patient has no known allergies.   Review of Systems Review of Systems  Constitutional: Positive for appetite change, chills and fever.  HENT: Negative for congestion, rhinorrhea and sore throat.   Respiratory: Negative for chest tightness and shortness of breath.   Cardiovascular: Negative for chest pain.  Gastrointestinal: Positive for diarrhea, nausea and vomiting.  Genitourinary: Negative for dysuria.  Skin: Negative for rash.      Physical Exam Updated Vital Signs BP 98/73   Pulse 84   Temp 98.1 F (36.7 C) (Oral)   Resp 16   Ht 5' 4.5" (1.638 m)   Wt 58.5 kg (129 lb)   LMP 07/27/2017   SpO2 100%   BMI 21.80 kg/m   Physical Exam  Constitutional: She is oriented to person, place, and time. She appears well-developed and well-nourished. No distress.  HENT:  Head: Normocephalic and atraumatic.  Nose: Nose normal.  Mouth/Throat: Oropharynx is clear and moist.  Eyes: Conjunctivae and EOM are normal.  Neck: Neck supple.  Cardiovascular: Normal rate, regular rhythm and normal heart sounds.  No murmur heard. Pulmonary/Chest: Effort normal and breath sounds normal.  Abdominal: Soft. Bowel sounds are normal. There is no tenderness.  Musculoskeletal: Normal range of motion. She exhibits no edema.  Neurological: She is alert and oriented to person, place, and time.  Skin: Skin is warm and dry. Capillary refill takes 2 to 3 seconds.  Psychiatric: She has a normal mood and affect.     ED Treatments / Results  Labs (all labs ordered are listed, but only abnormal results are displayed) Labs Reviewed  CBC WITH DIFFERENTIAL/PLATELET - Abnormal; Notable for the following components:      Result Value   Lymphs Abs 0.4 (*)    All other components within normal limits  COMPREHENSIVE METABOLIC PANEL - Abnormal; Notable for the following components:   Potassium 3.4 (*)    Creatinine, Ser 1.04 (*)    Calcium 8.4 (*)    Albumin 3.4 (*)    AST 59 (*)    All other components within normal limits  LIPASE, BLOOD    EKG None  Radiology No results found.  Procedures Procedures (including critical care time)  Medications Ordered in ED Medications  sodium chloride 0.9 % bolus 1,000 mL (0 mLs Intravenous Stopped 07/27/17 0920)  ondansetron (ZOFRAN) injection 4 mg (4 mg Intravenous Given 07/27/17 0803)  lactated ringers bolus 1,000 mL (0 mLs Intravenous Stopped 07/27/17 1032)     Initial Impression /  Assessment and Plan / ED Course  I have reviewed the triage vital signs and the nursing notes.  Pertinent labs & imaging results that were available during my care of the patient were reviewed by me and considered in my medical decision making (see chart for details).     History consistent with likely viral gastroenteritis versus food poisoning.  Will hydrate with 1 L normal saline, obtain labs, give Zofran.  CBC without elevation of WBC, diff with toxic granulations, suggestive of viral etiology. Less likely appendicitis based on these findings, and  patient elects to return if continued or worsening pain for imaging.  Will give 1 L additional normal saline bolus for further hydration. She did have UOP with an episode of diarrhea in the department. recommended against OTC antidiarrheals with likely viral gastro.   Final Clinical Impressions(s) / ED Diagnoses   Final diagnoses:  Gastroenteritis    ED Discharge Orders        Ordered    ondansetron (ZOFRAN ODT) 8 MG disintegrating tablet  Every 8 hours PRN     07/27/17 0857       Sela Hilding, MD 07/27/17 1055    Quintella Reichert, MD 07/27/17 1534

## 2017-07-27 NOTE — Discharge Instructions (Addendum)
You have a viral infection causing your nausea, vomiting, diarrhea.  Return if you have persistent or worsening abdominal pain, feel more dehydrated, or have other concerns.

## 2017-07-28 ENCOUNTER — Encounter (HOSPITAL_BASED_OUTPATIENT_CLINIC_OR_DEPARTMENT_OTHER): Payer: Self-pay | Admitting: Emergency Medicine

## 2017-07-28 ENCOUNTER — Other Ambulatory Visit: Payer: Self-pay

## 2017-07-28 ENCOUNTER — Emergency Department (HOSPITAL_BASED_OUTPATIENT_CLINIC_OR_DEPARTMENT_OTHER): Payer: Commercial Managed Care - PPO

## 2017-07-28 ENCOUNTER — Emergency Department (HOSPITAL_BASED_OUTPATIENT_CLINIC_OR_DEPARTMENT_OTHER)
Admission: EM | Admit: 2017-07-28 | Discharge: 2017-07-28 | Disposition: A | Discharge status: Home / Self Care | Payer: Commercial Managed Care - PPO | Attending: Emergency Medicine | Admitting: Emergency Medicine

## 2017-07-28 DIAGNOSIS — R197 Diarrhea, unspecified: Secondary | ICD-10-CM

## 2017-07-28 DIAGNOSIS — R109 Unspecified abdominal pain: Secondary | ICD-10-CM

## 2017-07-28 DIAGNOSIS — K529 Noninfective gastroenteritis and colitis, unspecified: Secondary | ICD-10-CM | POA: Insufficient documentation

## 2017-07-28 DIAGNOSIS — Z79899 Other long term (current) drug therapy: Secondary | ICD-10-CM | POA: Insufficient documentation

## 2017-07-28 LAB — C DIFFICILE QUICK SCREEN W PCR REFLEX
C DIFFICILE (CDIFF) TOXIN: NEGATIVE
C Diff antigen: NEGATIVE
C Diff interpretation: NOT DETECTED

## 2017-07-28 LAB — GASTROINTESTINAL PANEL BY PCR, STOOL (REPLACES STOOL CULTURE)
ADENOVIRUS F40/41: NOT DETECTED
Astrovirus: NOT DETECTED
Campylobacter species: NOT DETECTED
Cryptosporidium: NOT DETECTED
Cyclospora cayetanensis: NOT DETECTED
ENTEROAGGREGATIVE E COLI (EAEC): NOT DETECTED
ENTEROPATHOGENIC E COLI (EPEC): NOT DETECTED
Entamoeba histolytica: NOT DETECTED
Enterotoxigenic E coli (ETEC): NOT DETECTED
GIARDIA LAMBLIA: NOT DETECTED
NOROVIRUS GI/GII: NOT DETECTED
Plesimonas shigelloides: NOT DETECTED
ROTAVIRUS A: NOT DETECTED
SALMONELLA SPECIES: DETECTED — AB
SHIGELLA/ENTEROINVASIVE E COLI (EIEC): NOT DETECTED
Sapovirus (I, II, IV, and V): NOT DETECTED
Shiga like toxin producing E coli (STEC): NOT DETECTED
Vibrio cholerae: NOT DETECTED
Vibrio species: NOT DETECTED
Yersinia enterocolitica: NOT DETECTED

## 2017-07-28 LAB — PREGNANCY, URINE: Preg Test, Ur: NEGATIVE

## 2017-07-28 MED ORDER — DICYCLOMINE HCL 20 MG PO TABS: 20.0000 mg | ORAL_TABLET | Freq: Three times a day (TID) | ORAL | 0 refills | Status: DC | PRN

## 2017-07-28 MED ORDER — DICYCLOMINE HCL 10 MG PO CAPS
10.0000 mg | ORAL_CAPSULE | Freq: Once | ORAL | Status: AC
  Administered 2017-07-28: 10 mg via ORAL
  Filled 2017-07-28: qty 1

## 2017-07-28 MED ORDER — METRONIDAZOLE 500 MG PO TABS: 500.0000 mg | ORAL_TABLET | Freq: Two times a day (BID) | ORAL | 0 refills | Status: DC

## 2017-07-28 MED ORDER — CIPROFLOXACIN HCL 500 MG PO TABS
500.0000 mg | ORAL_TABLET | Freq: Once | ORAL | Status: AC
  Administered 2017-07-28: 500 mg via ORAL
  Filled 2017-07-28: qty 1

## 2017-07-28 MED ORDER — CIPROFLOXACIN HCL 500 MG PO TABS: 500.0000 mg | ORAL_TABLET | Freq: Two times a day (BID) | ORAL | 0 refills | Status: DC

## 2017-07-28 MED ORDER — METRONIDAZOLE 500 MG PO TABS
500.0000 mg | ORAL_TABLET | Freq: Once | ORAL | Status: AC
Start: 2017-07-28 — End: 2017-07-28
  Administered 2017-07-28: 500 mg via ORAL
  Filled 2017-07-28: qty 1

## 2017-07-28 MED ORDER — HYDROCODONE-ACETAMINOPHEN 5-325 MG PO TABS: 1.0000 | ORAL_TABLET | ORAL | 0 refills | Status: DC | PRN

## 2017-07-28 MED ORDER — HYDROCODONE-ACETAMINOPHEN 5-325 MG PO TABS
1.0000 | ORAL_TABLET | Freq: Once | ORAL | Status: AC
  Administered 2017-07-28: 1 via ORAL

## 2017-07-28 MED ORDER — HYDROCODONE-ACETAMINOPHEN 5-325 MG PO TABS
ORAL_TABLET | ORAL | Status: AC
  Filled 2017-07-28: qty 1

## 2017-07-28 NOTE — ED Provider Notes (Signed)
[facility] EMERGENCY DEPARTMENT Provider Note   CSN: [id number] Arrival date & time: 07/28/17  0440     History   Chief Complaint Chief Complaint  Patient presents with  . Diarrhea    HPI Elaine Edwards is a 47 y.o. female.  HPI Patient is a 47 year old female presents the emergency department with nausea vomiting diarrhea which began on Sunday afternoon.  She had fever at that time of 202.  She was seen in the emergency department yesterday and was evaluated with labs and discharged home.  She was on recent antibiotics for breast cyst approximately 2 weeks ago.  She does not remember the name of the antibiotic.  No history of C. difficile colitis.  No recent hospitalization.  No recent contact with anyone with diarrhea.  She has severe cramping which is what prompts her return in the emergency department as well as the development of new blood in her stool since yesterday.  She states she still having some loose bowel movements with scattered blood now.  No history of ulcerative colitis or Crohn's disease.  No recent weight changes.  Was able to eat and drink some today and overall feels better than she did yesterday secondary to the IV fluids that were given the emergency department but the blood is partly her new concern.   Past Medical History:  Diagnosis Date  . 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 IBS (irritable bowel syndrome)     Patient Active Problem List   Diagnosis Date Noted  . 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 Patella-femoral syndrome 11/13/2014    Past Surgical History:  Procedure Laterality Date  . BREAST BIOPSY Right 08/10/2012   Procedure: NEEDLE LOCALIZATION REMOVAL RIGHT BREAST CALCIFICATIONS;  Surgeon: Haywood Lasso, MD;  Location: [facility];  Service: General;  Laterality: Right;  . BREAST EXCISIONAL BIOPSY Right 2014   Benign   . EAR EXAMINATION UNDER ANESTHESIA  1994   left  . LEEP  1998   pathology not in  Epic - will do pap X 20 years  . OOPHORECTOMY Right 1998   benign tumor     OB History    Gravida  0   Para  0   Term  0   Preterm  0   AB  0   Living  0     SAB  0   TAB  0   Ectopic  0   Multiple  0   Live Births  0            Home Medications    Prior to Admission medications   Medication Sig Start Date End Date Taking? Authorizing Provider  ciprofloxacin (CIPRO) 500 MG tablet Take 1 tablet (500 mg total) by mouth 2 (two) times daily. 07/28/17   Jola Schmidt, MD  dicyclomine (BENTYL) 20 MG tablet Take 1 tablet (20 mg total) by mouth every 8 (eight) hours as needed for spasms. 07/28/17   Jola Schmidt, MD  estradiol (ESTRACE) 1 MG tablet Take 1 tablet (1 mg total) by mouth daily. 07/06/17   Megan Salon, MD  metroNIDAZOLE (FLAGYL) 500 MG tablet Take 1 tablet (500 mg total) by mouth 2 (two) times daily. 07/28/17   Jola Schmidt, MD  Multiple Vitamins-Minerals (MULTIVITAMIN WITH MINERALS) tablet Take 1 tablet by mouth daily.    [provider]  ondansetron (ZOFRAN ODT) 8 MG disintegrating tablet Take 1 tablet (8 mg total) by mouth every 8 (  eight) hours as needed for nausea or vomiting. 07/27/17   Sela Hilding, MD  pantoprazole (PROTONIX) 40 MG tablet Take 1 tablet by mouth daily. 09/06/15   [provider]  progesterone (PROMETRIUM) 200 MG capsule Take 1 tablet nightly days 1-15 each months. 07/06/17   Megan Salon, MD    Family History Family History  Problem Relation Age of Onset  . Hyperlipidemia Mother   . Hypertension Mother   . Diabetes Father   . Cancer Maternal Grandfather 60       esophageal  . Stroke Maternal Grandmother   . Breast cancer Neg Hx     Social History Social History   Tobacco Use  . Smoking status: Never Smoker  . Smokeless tobacco: Never Used  Substance Use Topics  . Alcohol use: No  . Drug use: No     Allergies   Patient has no known allergies.   Review of Systems Review of Systems  All other  systems reviewed and are negative.    Physical Exam Updated Vital Signs BP 113/76   Pulse 87   Temp 98.4 F (36.9 C) (Oral)   Resp 16   Ht 5' 4.5" (1.638 m)   Wt 58.5 kg (129 lb)   LMP 07/27/2017 Comment: (-) urine pregnancy test//a.c.  SpO2 100%   BMI 21.80 kg/m   Physical Exam  Constitutional: She is oriented to person, place, and time. She appears well-developed and well-nourished. No distress.  HENT:  Head: Normocephalic and atraumatic.  Eyes: EOM are normal.  Neck: Normal range of motion.  Cardiovascular: Normal rate.  Pulmonary/Chest: Effort normal.  Abdominal: Soft. She exhibits no distension. There is no tenderness.  Musculoskeletal: Normal range of motion.  Neurological: She is alert and oriented to person, place, and time.  Skin: Skin is warm and dry.  Psychiatric: She has a normal mood and affect. Judgment normal.  Nursing note and vitals reviewed.    ED Treatments / Results  Labs (all labs ordered are listed, but only abnormal results are displayed) Labs Reviewed  C DIFFICILE QUICK SCREEN W PCR REFLEX  GASTROINTESTINAL PANEL BY PCR, STOOL (REPLACES STOOL CULTURE)  PREGNANCY, URINE    EKG None  Radiology Dg Abd 2 Views  Result Date: 07/28/2017 CLINICAL DATA:  Nausea, vomiting, and diarrhea for 4 days. EXAM: ABDOMEN - 2 VIEW COMPARISON:  None. FINDINGS: Scattered gas and stool in the colon. No small or large bowel distention. No free intra-abdominal air. No abnormal air-fluid levels. No radiopaque stones. Visualized bones appear intact. Calcified phleboliths in the pelvis. IMPRESSION: Nonobstructive bowel gas pattern. Electronically Signed   By: Lucienne Capers M.D.   On: 07/28/2017 06:09    Procedures Procedures (including critical care time)  Medications Ordered in ED Medications  dicyclomine (BENTYL) capsule 10 mg (10 mg Oral Given 07/28/17 0531)  metroNIDAZOLE (FLAGYL) tablet 500 mg (500 mg Oral Given 07/28/17 0531)  ciprofloxacin (CIPRO)  tablet 500 mg (500 mg Oral Given 07/28/17 0531)     Initial Impression / Assessment and Plan / ED Course  I have reviewed the triage vital signs and the nursing notes.  Pertinent labs & imaging results that were available during my care of the patient were reviewed by me and considered in my medical decision making (see chart for details).     Overall well-appearing.  Vital signs are stable.  Labs from yesterday reviewed.  Likely acute colitis.  Patient will be initiated on Cipro and Flagyl should be given Bentyl for the  cramping.  I suspect she will improve over the next 24 to 48 hours.  She was on recent antibiotics and therefore C. difficile sample has been sent.  This is pending at this time.  Close PCP follow-up.  Follow-up with a GI specialist in 1 week if symptoms persist.  Recommended ongoing oral hydration at home.  She is encouraged to return the emergency department for new or worsening symptoms.  Overall she is well-appearing at the bedside she has good color she does not need IV fluids.   Final Clinical Impressions(s) / ED Diagnoses   Final diagnoses:  Diarrhea  Abdominal pain  Acute colitis    ED Discharge Orders        Ordered    ciprofloxacin (CIPRO) 500 MG tablet  2 times daily,   Status:  Discontinued     07/28/17 0626    metroNIDAZOLE (FLAGYL) 500 MG tablet  2 times daily,   Status:  Discontinued     07/28/17 0626    dicyclomine (BENTYL) 20 MG tablet  Every 8 hours PRN,   Status:  Discontinued     07/28/17 0626    ciprofloxacin (CIPRO) 500 MG tablet  2 times daily     07/28/17 0628    dicyclomine (BENTYL) 20 MG tablet  Every 8 hours PRN     07/28/17 0628    metroNIDAZOLE (FLAGYL) 500 MG tablet  2 times daily     07/28/17 2924       Jola Schmidt, MD 07/28/17 [id number]

## 2017-07-28 NOTE — ED Notes (Signed)
ED Provider at bedside. 

## 2017-07-28 NOTE — ED Notes (Signed)
Pt attempted to provide stool sample but unable to go at t his time.

## 2017-07-28 NOTE — ED Triage Notes (Signed)
Pt seen here yesterday for diarrhea and abd pain. Pt returning for worsening abd cramping and states blood in stool now.

## 2017-07-30 ENCOUNTER — Other Ambulatory Visit: Payer: Self-pay

## 2017-07-30 ENCOUNTER — Emergency Department (HOSPITAL_BASED_OUTPATIENT_CLINIC_OR_DEPARTMENT_OTHER)
Admission: EM | Admit: 2017-07-30 | Discharge: 2017-07-30 | Disposition: A | Discharge status: Home / Self Care | Payer: Commercial Managed Care - PPO | Attending: Emergency Medicine | Admitting: Emergency Medicine

## 2017-07-30 ENCOUNTER — Encounter (HOSPITAL_BASED_OUTPATIENT_CLINIC_OR_DEPARTMENT_OTHER): Payer: Self-pay | Admitting: *Deleted

## 2017-07-30 DIAGNOSIS — A020 Salmonella enteritis: Secondary | ICD-10-CM | POA: Diagnosis not present

## 2017-07-30 DIAGNOSIS — Z79899 Other long term (current) drug therapy: Secondary | ICD-10-CM | POA: Diagnosis not present

## 2017-07-30 DIAGNOSIS — R197 Diarrhea, unspecified: Secondary | ICD-10-CM | POA: Diagnosis present

## 2017-07-30 DIAGNOSIS — IMO0001 Reserved for inherently not codable concepts without codable children: Secondary | ICD-10-CM

## 2017-07-30 LAB — CBC WITH DIFFERENTIAL/PLATELET
Basophils Absolute: 0 10*3/uL (ref 0.0–0.1)
Basophils Relative: 0 %
EOS ABS: 0.1 10*3/uL (ref 0.0–0.7)
Eosinophils Relative: 1 %
HCT: 35.5 % — ABNORMAL LOW (ref 36.0–46.0)
Hemoglobin: 12.4 g/dL (ref 12.0–15.0)
LYMPHS ABS: 1.9 10*3/uL (ref 0.7–4.0)
Lymphocytes Relative: 28 %
MCH: 28.7 pg (ref 26.0–34.0)
MCHC: 34.9 g/dL (ref 30.0–36.0)
MCV: 82.2 fL (ref 78.0–100.0)
MONOS PCT: 11 %
Monocytes Absolute: 0.7 10*3/uL (ref 0.1–1.0)
Neutro Abs: 3.9 10*3/uL (ref 1.7–7.7)
Neutrophils Relative %: 60 %
PLATELETS: 380 10*3/uL (ref 150–400)
RBC: 4.32 MIL/uL (ref 3.87–5.11)
RDW: 13.8 % (ref 11.5–15.5)
WBC: 6.5 10*3/uL (ref 4.0–10.5)

## 2017-07-30 LAB — BASIC METABOLIC PANEL
ANION GAP: 7 (ref 5–15)
BUN: 7 mg/dL (ref 6–20)
CO2: 28 mmol/L (ref 22–32)
CREATININE: 0.97 mg/dL (ref 0.44–1.00)
Calcium: 8.2 mg/dL — ABNORMAL LOW (ref 8.9–10.3)
Chloride: 104 mmol/L (ref 98–111)
GFR calc Af Amer: >60 mL/min (ref >60)
GFR calc non Af Amer: >60 mL/min (ref >60)
Glucose, Bld: 87 mg/dL (ref 70–99)
Potassium: 3.7 mmol/L (ref 3.5–5.1)
Sodium: 139 mmol/L (ref 135–145)

## 2017-07-30 MED ORDER — DEXTROSE-NACL 5-0.9 % IV SOLN
INTRAVENOUS | Status: AC
  Administered 2017-07-30: 14:00:00 via INTRAVENOUS

## 2017-07-30 NOTE — ED Provider Notes (Signed)
[facility] EMERGENCY DEPARTMENT Provider Note   CSN: [id number] Arrival date & time: 07/30/17  1200     History   Chief Complaint Chief Complaint  Patient presents with  . Diarrhea    HPI Elaine Edwards is a 47 y.o. female.  HPI   Patient presents today with her husband.  She has been seen twice in the last week for nausea, vomiting, diarrhea.  She was prescribed Cipro and Flagyl on 7/24, which she picked up.  Stool studies from that day were found to be positive for Salmonella enteritis.  She discussed this with her PCP who  recommended that she come into the emergency room for further treatment given the fact that she has lightheadedness while seated and worsened by ambulation.  She has been taking both Cipro and Flagyl, and states she has only had 2 episodes of diarrhea this morning.  She did note blood in her diarrhea yesterday.  She had 10 total episodes yesterday.  She is able to sleep through the night last night which is an improvement from previous.  She has been drinking copious fluids, has eaten some crackers.  No more emesis or nausea.  Past Medical History:  Diagnosis Date  . 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 IBS (irritable bowel syndrome)     Patient Active Problem List   Diagnosis Date Noted  . 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 Patella-femoral syndrome 11/13/2014    Past Surgical History:  Procedure Laterality Date  . BREAST BIOPSY Right 08/10/2012   Procedure: NEEDLE LOCALIZATION REMOVAL RIGHT BREAST CALCIFICATIONS;  Surgeon: Haywood Lasso, MD;  Location: [facility];  Service: General;  Laterality: Right;  . BREAST EXCISIONAL BIOPSY Right 2014   Benign   . EAR EXAMINATION UNDER ANESTHESIA  1994   left  . LEEP  1998   pathology not in Epic - will do pap X 20 years  . OOPHORECTOMY Right 1998   benign tumor     OB History    Gravida  0   Para  0   Term  0   Preterm  0   AB  0   Living  0     SAB    0   TAB  0   Ectopic  0   Multiple  0   Live Births  0            Home Medications    Prior to Admission medications   Medication Sig Start Date End Date Taking? Authorizing Provider  ciprofloxacin (CIPRO) 500 MG tablet Take 1 tablet (500 mg total) by mouth 2 (two) times daily. 07/28/17   Jola Schmidt, MD  dicyclomine (BENTYL) 20 MG tablet Take 1 tablet (20 mg total) by mouth every 8 (eight) hours as needed for spasms. 07/28/17   Jola Schmidt, MD  estradiol (ESTRACE) 1 MG tablet Take 1 tablet (1 mg total) by mouth daily. 07/06/17   Megan Salon, MD  HYDROcodone-acetaminophen (NORCO/VICODIN) 5-325 MG tablet Take 1 tablet by mouth every 4 (four) hours as needed for moderate pain. 07/28/17   Jola Schmidt, MD  metroNIDAZOLE (FLAGYL) 500 MG tablet Take 1 tablet (500 mg total) by mouth 2 (two) times daily. 07/28/17   Jola Schmidt, MD  Multiple Vitamins-Minerals (MULTIVITAMIN WITH MINERALS) tablet Take 1 tablet by mouth daily.    [provider]  ondansetron (ZOFRAN ODT) 8 MG disintegrating tablet Take 1 tablet (8 mg total) by mouth every  8 (eight) hours as needed for nausea or vomiting. 07/27/17   Sela Hilding, MD  pantoprazole (PROTONIX) 40 MG tablet Take 1 tablet by mouth daily. 09/06/15   [provider]  progesterone (PROMETRIUM) 200 MG capsule Take 1 tablet nightly days 1-15 each months. 07/06/17   Megan Salon, MD    Family History Family History  Problem Relation Age of Onset  . Hyperlipidemia Mother   . Hypertension Mother   . Diabetes Father   . Cancer Maternal Grandfather 60       esophageal  . Stroke Maternal Grandmother   . Breast cancer Neg Hx     Social History Social History   Tobacco Use  . Smoking status: Never Smoker  . Smokeless tobacco: Never Used  Substance Use Topics  . Alcohol use: No  . Drug use: No     Allergies   Patient has no known allergies.   Review of Systems Review of Systems  Constitutional: Positive for  activity change and appetite change. Negative for chills and fever.  HENT: Negative for congestion.   Respiratory: Negative for cough and shortness of breath.   Cardiovascular: Negative for chest pain.  Gastrointestinal: Positive for diarrhea. Negative for abdominal pain, constipation, nausea and vomiting.  Genitourinary: Negative for dysuria.  All other systems reviewed and are negative.    Physical Exam Updated Vital Signs BP 99/68 (BP Location: Left Arm)   Pulse 66   Temp 97.8 F (36.6 C)   Resp 18   Ht 5' 4.5" (1.638 m)   Wt 57.8 kg (127 lb 8 oz)   LMP 07/27/2017 Comment: (-) urine pregnancy test//a.c.  SpO2 100%   BMI 21.55 kg/m   Physical Exam  Constitutional: She is oriented to person, place, and time. She appears well-developed and well-nourished.  HENT:  Head: Normocephalic and atraumatic.  Eyes: Conjunctivae and EOM are normal.  Neck: Normal range of motion. Neck supple.  Cardiovascular: Normal rate, regular rhythm and normal heart sounds.  Pulmonary/Chest: Effort normal and breath sounds normal.  Abdominal: Soft. Bowel sounds are normal. She exhibits no distension. There is no tenderness.  Musculoskeletal: Normal range of motion. She exhibits no edema.  Neurological: She is alert and oriented to person, place, and time. No cranial nerve deficit.  Skin: Skin is warm and dry. Capillary refill takes less than 2 seconds. No rash noted.  Psychiatric: She has a normal mood and affect.     ED Treatments / Results  Labs (all labs ordered are listed, but only abnormal results are displayed) Labs Reviewed  BASIC METABOLIC PANEL - Abnormal; Notable for the following components:      Result Value   Calcium 8.2 (*)    All other components within normal limits  CBC WITH DIFFERENTIAL/PLATELET - Abnormal; Notable for the following components:   HCT 35.5 (*)    All other components within normal limits    EKG None  Radiology No results  found.  Procedures Procedures (including critical care time)  Medications Ordered in ED Medications  dextrose 5 %-0.9 % sodium chloride infusion ( Intravenous Rate/Dose Change 07/30/17 1412)     Initial Impression / Assessment and Plan / ED Course  I have reviewed the triage vital signs and the nursing notes.  Pertinent labs & imaging results that were available during my care of the patient were reviewed by me and considered in my medical decision making (see chart for details).     Patient here with concerns for dehydration in  the setting of Salmonella enteritis.  She is on appropriate therapy and will continue this.  Her PCP instructed her to stop Flagyl, which is appropriate given that we have identified that organism which will be treated by for quinolones.  We will hydrate with 2 L fluid and check potassium given diarrhea.  Potassium within normal range.  Patient feels improved after 2 L of fluid pill will discharge with continued antibiotics as prescribed, and return if further concerns.  Final Clinical Impressions(s) / ED Diagnoses   Final diagnoses:  Salmonella enteritidis    ED Discharge Orders    None       Sela Hilding, MD 07/30/17 [place], Wenda Overland, MD 08/09/17 1328

## 2017-07-30 NOTE — ED Notes (Signed)
ED Provider at bedside. 

## 2017-07-30 NOTE — Discharge Instructions (Addendum)
Your salmonella is being treated correctly with the cipro. You should continue to use hand washing to keep transmission down. Continue to try drink lots of liquids to keep yourself hydrated. Return if you have worsening diarrhea or concerns for dehydration.

## 2017-07-30 NOTE — ED Triage Notes (Signed)
Abdominal pain vomiting and diarrhea. She was seen 3 days ago and got IV fluids. She was seen again 2 days for bloody diarrhea. This am she has been dizzy. Her MD was called and told her to come for more fluids. Her stool culture grew salmonella.

## 2017-08-10 ENCOUNTER — Ambulatory Visit
Admission: RE | Admit: 2017-08-10 | Discharge: 2017-08-10 | Disposition: A | Discharge status: Home / Self Care | Payer: Commercial Managed Care - PPO | Source: Ambulatory Visit | Attending: Obstetrics & Gynecology | Admitting: Obstetrics & Gynecology

## 2017-08-10 DIAGNOSIS — N6310 Unspecified lump in the right breast, unspecified quadrant: Secondary | ICD-10-CM

## 2017-08-31 ENCOUNTER — Ambulatory Visit: Payer: Commercial Managed Care - PPO | Admitting: Obstetrics & Gynecology

## 2017-09-21 ENCOUNTER — Ambulatory Visit: Payer: Commercial Managed Care - PPO | Admitting: Obstetrics & Gynecology

## 2017-09-21 ENCOUNTER — Other Ambulatory Visit: Payer: Self-pay | Admitting: Obstetrics & Gynecology

## 2017-09-21 ENCOUNTER — Telehealth: Payer: Self-pay | Admitting: Obstetrics & Gynecology

## 2017-09-21 ENCOUNTER — Encounter: Payer: Self-pay | Admitting: Obstetrics & Gynecology

## 2017-09-21 NOTE — Telephone Encounter (Signed)
Patient canceled her f/u amh appointment this afternoon due to a work conflict. Patient rescheduled to 09/28/17 st 4:88SD. [facility] policy followed.

## 2017-09-22 NOTE — Telephone Encounter (Signed)
Rx completed.  Pt has follow up scheduled for 9/24 and 10/3 for same thing.  Looks like she was just in the ER for Salmonella enteritis.  I hate to bother her but I think we don't need both of these appts.  Can you check into this?  Thanks.

## 2017-09-22 NOTE — Telephone Encounter (Signed)
Medication refill request: progesterone and Estradial  Last AEX:  05/14/2017 Next AEX: 08/19/2018 Last MMG (if hormonal medication request): 08/10/2017 BI-RADS CATEGORY  2: Benign. Refill authorized: #45, 1 refill, #90, 3 refills.

## 2017-09-22 NOTE — Telephone Encounter (Signed)
Left message advising patient she should call and cancel one of her appointments with our office.

## 2017-09-28 ENCOUNTER — Ambulatory Visit: Payer: Commercial Managed Care - PPO | Admitting: Obstetrics & Gynecology

## 2017-09-28 ENCOUNTER — Encounter: Payer: Self-pay | Admitting: Obstetrics & Gynecology

## 2017-09-28 VITALS — BP 108/70 | HR 72 | Resp 16 | Ht 64.5 in | Wt 128.4 lb

## 2017-09-28 DIAGNOSIS — N912 Amenorrhea, unspecified: Secondary | ICD-10-CM

## 2017-09-28 DIAGNOSIS — Z79890 Hormone replacement therapy (postmenopausal): Secondary | ICD-10-CM

## 2017-09-28 NOTE — Progress Notes (Signed)
GYNECOLOGY  VISIT  CC:   Recheck after starting HRT and recheck AMH  HPI: 47 y.o. G0P0000 Married White or Caucasian female here for follow up [place].  Started HRT in July.  Did have some spotting in July.  Still having hot flashes but they are improved.  Overall, she feels symptoms are very manageable.  Sleep is better.  [place] is better.  No skin or hair issues.  Taking progetsterone days 1-15 each month.  Has not had any VB during second half of the month when off the progesterone.  Previous AMH levels reviewed.  First was undetectable.  She was on OCPs at that time.  Second one was still very low but not undetectable.  As she came off pills, suspect this was the cause of fluctuation in lab work.  Questions answered.Marland Kitchen    GYNECOLOGIC HISTORY: No LMP recorded. Patient is perimenopausal. Contraception: condoms  Menopausal hormone therapy: none  Patient Active Problem List   Diagnosis Date Noted  . 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 Patella-femoral syndrome 11/13/2014    Past Medical History:  Diagnosis Date  . 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 IBS (irritable bowel syndrome)     Past Surgical History:  Procedure Laterality Date  . BREAST BIOPSY Right 08/10/2012   Procedure: NEEDLE LOCALIZATION REMOVAL RIGHT BREAST CALCIFICATIONS;  Surgeon: Haywood Lasso, MD;  Location: [facility];  Service: General;  Laterality: Right;  . BREAST EXCISIONAL BIOPSY Right 2014   Benign   . EAR EXAMINATION UNDER ANESTHESIA  1994   left  . LEEP  1998   pathology not in Epic - will do pap X 20 years  . OOPHORECTOMY Right 1998   benign tumor    MEDS:   Current Outpatient Medications on File Prior to Visit  Medication Sig Dispense Refill  . estradiol (ESTRACE) 1 MG tablet TAKE 1 TABLET(1 MG) BY MOUTH DAILY 90 tablet 0  . Multiple Vitamins-Minerals (MULTIVITAMIN WITH MINERALS) tablet Take 1 tablet by mouth daily.    . progesterone (PROMETRIUM) 200 MG capsule TAKE ONE  CAPSULE BY MOUTH NIGHTLY ON DAYS 1 TO 15 OF EACH MONTH 45 capsule 0   No current facility-administered medications on file prior to visit.     ALLERGIES: Patient has no known allergies.  Family History  Problem Relation Age of Onset  . Hyperlipidemia Mother   . Hypertension Mother   . Diabetes Father   . Cancer Maternal Grandfather 60       esophageal  . Stroke Maternal Grandmother   . Breast cancer Neg Hx     SH:  Married, non smoker  Review of Systems  All other systems reviewed and are negative.   PHYSICAL EXAMINATION:    BP 108/70 (BP Location: Right Arm, Patient Position: Sitting, Cuff Size: Normal)   Pulse 72   Resp 16   Ht 5' 4.5" (1.638 m)   Wt 128 lb 6.4 oz (58.2 kg)   BMI 21.70 kg/m     General appearance: alert, cooperative and appears stated age No other exam performed  Assessment: PMP, no HRT  Plan: Will recheck AMH.  If continues to be low, will stay on HRT.  She does not want to increase estrogen dosage at she is managing very well with current dosing.  Will also keep progesterone dosing to days 1-15 each month.     ~15 minutes spent with patient >50% of time was in face to face discussion of above.

## 2017-10-02 LAB — ANTI MULLERIAN HORMONE: ANTI-MULLERIAN HORMONE (AMH): 0.017 ng/mL

## 2017-10-07 ENCOUNTER — Ambulatory Visit: Payer: Commercial Managed Care - PPO | Admitting: Obstetrics & Gynecology

## 2017-11-10 ENCOUNTER — Telehealth: Payer: Self-pay | Admitting: Obstetrics & Gynecology

## 2017-11-10 NOTE — Telephone Encounter (Signed)
Spoke with patient. On HRT, takes estradiol 1mg  tab daily and Prometrium 200 mg days 1-15 each month. Calling to provide update.   Light bleeding for 2-3 days 10/15 Heavier bleeding for 2-3 days 11/1  Not currently bleeding, denies pain or any other GYN symptoms. Advised will update Dr. Sabra Heck and our office will return call with recommendations. Patient agreeable.  Routing to Dr. Sabra Heck to advise.

## 2017-11-10 NOTE — Telephone Encounter (Signed)
Patient calling to give follow up symptoms from new hormone medication.

## 2017-11-10 NOTE — Telephone Encounter (Signed)
Left message to call Kayleena Eke, RN at [facility] [phone].   

## 2017-11-12 NOTE — Telephone Encounter (Signed)
Call to patient. Message given to patient as seen below from Dr. Sabra Heck and she verbalized understanding.   Routing to provider and will close encounter.

## 2017-11-12 NOTE — Telephone Encounter (Signed)
I think this is first month with irregular bleeding.  Keep monitoring.  If continues, will need to discuss HRT adjustment.

## 2017-12-07 ENCOUNTER — Encounter

## 2017-12-07 ENCOUNTER — Ambulatory Visit: Payer: Commercial Managed Care - PPO | Admitting: Obstetrics & Gynecology

## 2017-12-17 ENCOUNTER — Other Ambulatory Visit: Payer: Self-pay | Admitting: Obstetrics & Gynecology

## 2017-12-17 NOTE — Telephone Encounter (Signed)
Medication refill request: Progesterone 200 mg  Last AEX:05/14/17    Next AEX: 08/19/18 Last MMG (if hormonal medication request): 08/10/17 Ultrasound Bi-rads 2 Benign  Refill authorized: #45 with 0 RF    Medication refill request: Estrace 1mg   Last AEX:05/14/17    Next AEX: 08/19/18 Last MMG (if hormonal medication request): 08/10/17 Ultrasound Bi-rads 2 Benign  Refill authorized: #90 with 0RF

## 2018-08-02 ENCOUNTER — Telehealth: Payer: Self-pay | Admitting: *Deleted

## 2018-08-02 NOTE — Telephone Encounter (Signed)
Left message on voicemail to call and reschedule cancelled appointment. °

## 2018-08-15 ENCOUNTER — Other Ambulatory Visit: Payer: Self-pay | Admitting: Obstetrics & Gynecology

## 2018-08-15 DIAGNOSIS — Z1231 Encounter for screening mammogram for malignant neoplasm of breast: Secondary | ICD-10-CM

## 2018-08-19 ENCOUNTER — Ambulatory Visit: Payer: Commercial Managed Care - PPO | Admitting: Obstetrics & Gynecology

## 2018-09-04 ENCOUNTER — Other Ambulatory Visit: Payer: Self-pay | Admitting: Obstetrics & Gynecology

## 2018-09-05 NOTE — Telephone Encounter (Signed)
Medication refill request: Estradiol  Last AEX:  05-14-17 SM  Next AEX: 10-14-2018 Last MMG (if hormonal medication request): 08-10-17 [place] right breast- BIRADS 2 benign, scheduled for 09-27-2018  Refill authorized: Today, please advise.   Medication pended for #90, 0RF. Please refill if appropriate.

## 2018-09-27 ENCOUNTER — Ambulatory Visit: Payer: Commercial Managed Care - PPO

## 2018-10-14 ENCOUNTER — Ambulatory Visit: Payer: Commercial Managed Care - PPO | Admitting: Obstetrics & Gynecology

## 2018-10-24 ENCOUNTER — Other Ambulatory Visit: Payer: Self-pay | Admitting: *Deleted

## 2018-10-24 NOTE — Telephone Encounter (Signed)
Patient requesting a refill on progesterone. AEX cancelled from 10/20 because provider not in office at the time of appointment. [facility] at [phone]

## 2018-10-25 ENCOUNTER — Ambulatory Visit: Payer: Commercial Managed Care - PPO | Admitting: Obstetrics & Gynecology

## 2018-10-26 MED ORDER — PROGESTERONE MICRONIZED 200 MG PO CAPS: ORAL_CAPSULE | ORAL | 1 refills | Status: DC

## 2018-10-26 NOTE — Telephone Encounter (Signed)
Medication refill request: progesterone 200 mg  Last AEX:  05/15/18 Next AEX: Appointment canceled for yesterday 10/20 due to provider not being in office   Last MMG (if hormonal medication request):  Refill authorized: 08/10/17 US breast ultasound bi rads 2 benign

## 2018-11-10 ENCOUNTER — Other Ambulatory Visit: Payer: Self-pay

## 2018-11-10 ENCOUNTER — Ambulatory Visit
Admission: RE | Admit: 2018-11-10 | Discharge: 2018-11-10 | Disposition: A | Discharge status: Home / Self Care | Payer: Commercial Managed Care - PPO | Source: Ambulatory Visit | Attending: Obstetrics & Gynecology | Admitting: Obstetrics & Gynecology

## 2018-11-10 DIAGNOSIS — Z1231 Encounter for screening mammogram for malignant neoplasm of breast: Secondary | ICD-10-CM

## 2018-11-24 ENCOUNTER — Other Ambulatory Visit: Payer: Self-pay | Admitting: Obstetrics & Gynecology

## 2018-12-03 ENCOUNTER — Other Ambulatory Visit: Payer: Self-pay | Admitting: Obstetrics & Gynecology

## 2018-12-05 NOTE — Telephone Encounter (Signed)
Patient returned call and scheduled aex for 01-13-19 at 0800. Agreeable to date and time of appointment.

## 2018-12-05 NOTE — Telephone Encounter (Signed)
Medication refill request: Estradiol  Last AEX:  05-14-17 SM  Next AEX: Message left for patient to return call to schedule  Last MMG (if hormonal medication request): 11-10-2018 density B/BIRADS 1 negative  Refill authorized: Today, please advise.   Message left for patient to return call to schedule aex. Medication pended for #30, 0RF. Please refill if appropriate.

## 2019-01-13 ENCOUNTER — Ambulatory Visit: Payer: Commercial Managed Care - PPO | Admitting: Obstetrics & Gynecology

## 2019-01-14 ENCOUNTER — Other Ambulatory Visit: Payer: Self-pay | Admitting: Obstetrics & Gynecology

## 2019-01-17 NOTE — Progress Notes (Signed)
49 y.o. G0P0000 Married White or Caucasian female here for annual exam.  Doing well.  Mother had a stroke in October 2019 so this is a big stressor for her.  Her mother is having memory issues.  She does have some light headed sensation in the first morning.    Frustrated with change in nail quality.    Having a lot of trouble with reflux.  Has regurgitation that occurs multiple times a week.    Patient's last menstrual period was 12/20/2018.          Sexually active: Yes.    The current method of family planning is perimenopausal. Exercising: Yes.    cardio, strength training Smoker:  no  Health Maintenance: Pap:  09/20/15 Neg. HR HPV:neg             History of abnormal Pap:  Yes, LEEP 1998 MMG: 11/10/18- BIRADS 1, Negative  Colonoscopy:  never.  Guidelines discussed.   BMD:   never TDaP:  2013 Screening Labs: discuss with provider   reports that she has never smoked. She has never used smokeless tobacco. She reports that she does not drink alcohol or use drugs.  Past Medical History:  Diagnosis Date  . 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 IBS (irritable bowel syndrome)     Past Surgical History:  Procedure Laterality Date  . BREAST BIOPSY Right 08/10/2012   Procedure: NEEDLE LOCALIZATION REMOVAL RIGHT BREAST CALCIFICATIONS;  Surgeon: Haywood Lasso, MD;  Location: [facility];  Service: General;  Laterality: Right;  . BREAST EXCISIONAL BIOPSY Right 2014   Benign   . EAR EXAMINATION UNDER ANESTHESIA  1994   left  . LEEP  1998   pathology not in Epic - will do pap X 20 years  . OOPHORECTOMY Right 1998   benign tumor    Current Outpatient Medications  Medication Sig Dispense Refill  . Docusate Calcium (STOOL SOFTENER PO) Take by mouth 2 (two) times daily.    Marland Kitchen estradiol (ESTRACE) 1 MG tablet TAKE 1 TABLET BY MOUTH DAILY 90 tablet 0  . Multiple Vitamins-Minerals (MULTIVITAMIN WITH MINERALS) tablet Take 1 tablet by mouth daily.    . progesterone  (PROMETRIUM) 200 MG capsule 1 capsule nightly days 1-15 of each month. 45 capsule 1   No current facility-administered medications for this visit.    Family History  Problem Relation Age of Onset  . Hyperlipidemia Mother   . Hypertension Mother   . Stroke Mother   . Diabetes Father   . Cancer Maternal Grandfather 60       esophageal  . Stroke Maternal Grandmother   . Breast cancer Neg Hx     Review of Systems  All other systems reviewed and are negative.   Exam:   BP 98/64 (BP Location: Right Arm, Patient Position: Sitting, Cuff Size: Normal)   Pulse 78   Temp (!) 97.2 F (36.2 C) (Skin)   Ht 5' 4.5" (1.638 m)   Wt 129 lb (58.5 kg)   LMP 12/20/2018   BMI 21.80 kg/m   Height: 5' 4.5" (163.8 cm)  Ht Readings from Last 3 Encounters:  01/20/19 5' 4.5" (1.638 m)  09/28/17 5' 4.5" (1.638 m)  07/30/17 5' 4.5" (1.638 m)   General appearance: alert, cooperative and appears stated age Head: Normocephalic, without obvious abnormality, atraumatic Neck: no adenopathy, supple, symmetrical, trachea midline and thyroid normal to inspection and palpation Lungs: clear to auscultation bilaterally Breasts: normal appearance, no masses or  tenderness Heart: regular rate and rhythm Abdomen: soft, non-tender; bowel sounds normal; no masses,  no organomegaly Extremities: extremities normal, atraumatic, no cyanosis or edema Skin: Skin color, texture, turgor normal. No rashes or lesions Lymph nodes: Cervical, supraclavicular, and axillary nodes normal. No abnormal inguinal nodes palpated Neurologic: Grossly normal   Pelvic: External genitalia:  no lesions              Urethra:  normal appearing urethra with no masses, tenderness or lesions              Bartholins and Skenes: normal                 Vagina: normal appearing vagina with normal color and discharge, no lesions              Cervix: no lesions              Pap taken: Yes.   Bimanual Exam:  Uterus:  normal size, contour,  position, consistency, mobility, non-tender              Adnexa: normal adnexa and no mass, fullness, tenderness               Rectovaginal: Confirms               Anus:  normal sphincter tone, no lesions  Chaperone, Karmen Bongo, RN, was present for exam.  A:  Well Woman with normal exam PMP, on HRT Leep 1998.  Neg pap with neg HR HPV 2017.   Grade C breast density GERD, regurgitation Memory concerns  P:   Mammogram guidelines reviewed pap smear and HR HPV obtained today No lab work obtained today GI referral made today Estradiol 1.0mg  daily and Prometrium 200mg  daily 1-15 Rx to pharmacy for 90 day supply/4RF. CBC, CMP obtained today Return annually or prn

## 2019-01-20 ENCOUNTER — Encounter: Payer: Self-pay | Admitting: Obstetrics & Gynecology

## 2019-01-20 ENCOUNTER — Other Ambulatory Visit: Payer: Self-pay | Admitting: Obstetrics & Gynecology

## 2019-01-20 ENCOUNTER — Other Ambulatory Visit (HOSPITAL_COMMUNITY)
Admission: RE | Admit: 2019-01-20 | Discharge: 2019-01-20 | Disposition: A | Discharge status: Home / Self Care | Payer: Commercial Managed Care - PPO | Source: Ambulatory Visit | Attending: Obstetrics & Gynecology | Admitting: Obstetrics & Gynecology

## 2019-01-20 ENCOUNTER — Ambulatory Visit (INDEPENDENT_AMBULATORY_CARE_PROVIDER_SITE_OTHER): Payer: Commercial Managed Care - PPO | Admitting: Obstetrics & Gynecology

## 2019-01-20 ENCOUNTER — Other Ambulatory Visit: Payer: Self-pay

## 2019-01-20 VITALS — BP 98/64 | HR 78 | Temp 97.2°F | Ht 64.5 in | Wt 129.0 lb

## 2019-01-20 DIAGNOSIS — Z0000 Encounter for general adult medical examination without abnormal findings: Secondary | ICD-10-CM | POA: Diagnosis not present

## 2019-01-20 DIAGNOSIS — Z124 Encounter for screening for malignant neoplasm of cervix: Secondary | ICD-10-CM | POA: Insufficient documentation

## 2019-01-20 DIAGNOSIS — Z01419 Encounter for gynecological examination (general) (routine) without abnormal findings: Secondary | ICD-10-CM | POA: Diagnosis not present

## 2019-01-20 DIAGNOSIS — K219 Gastro-esophageal reflux disease without esophagitis: Secondary | ICD-10-CM

## 2019-01-20 MED ORDER — PANTOPRAZOLE SODIUM 40 MG PO TBEC: 40.0000 mg | DELAYED_RELEASE_TABLET | Freq: Every day | ORAL | 1 refills | Status: DC

## 2019-01-20 MED ORDER — PROGESTERONE MICRONIZED 200 MG PO CAPS: ORAL_CAPSULE | ORAL | 4 refills | Status: DC

## 2019-01-20 MED ORDER — ESTRADIOL 1 MG PO TABS: 1.0000 mg | ORAL_TABLET | Freq: Every day | ORAL | 4 refills | Status: DC

## 2019-01-20 NOTE — Patient Instructions (Addendum)
[place], [place] [address]  [place]: [phone] Fax: [phone] Hours (M-F): 8am - 5pm  [place]: Betty Martinique, Grier Mitts

## 2019-01-21 LAB — COMPREHENSIVE METABOLIC PANEL
ALT: 13 IU/L (ref 0–32)
AST: 21 IU/L (ref 0–40)
Albumin/Globulin Ratio: 1.7 (ref 1.2–2.2)
Albumin: 4.5 g/dL (ref 3.8–4.8)
Alkaline Phosphatase: 78 IU/L (ref 39–117)
BUN/Creatinine Ratio: 14 (ref 9–23)
BUN: 13 mg/dL (ref 6–24)
Bilirubin Total: 0.4 mg/dL (ref 0.0–1.2)
CO2: 25 mmol/L (ref 20–29)
Calcium: 9.3 mg/dL (ref 8.7–10.2)
Chloride: 102 mmol/L (ref 96–106)
Creatinine, Ser: 0.9 mg/dL (ref 0.57–1.00)
GFR calc Af Amer: 87 mL/min/{1.73_m2} (ref >59)
GFR calc non Af Amer: 76 mL/min/{1.73_m2} (ref >59)
Globulin, Total: 2.7 g/dL (ref 1.5–4.5)
Glucose: 82 mg/dL (ref 65–99)
Potassium: 3.9 mmol/L (ref 3.5–5.2)
Sodium: 140 mmol/L (ref 134–144)
Total Protein: 7.2 g/dL (ref 6.0–8.5)

## 2019-01-21 LAB — CBC
Hematocrit: 42.1 % (ref 34.0–46.6)
Hemoglobin: 14.1 g/dL (ref 11.1–15.9)
MCH: 30.3 pg (ref 26.6–33.0)
MCHC: 33.5 g/dL (ref 31.5–35.7)
MCV: 90 fL (ref 79–97)
Platelets: 368 10*3/uL (ref 150–450)
RBC: 4.66 x10E6/uL (ref 3.77–5.28)
RDW: 12.8 % (ref 11.7–15.4)
WBC: 5.6 10*3/uL (ref 3.4–10.8)

## 2019-01-23 LAB — CYTOLOGY - PAP
Comment: NEGATIVE
Diagnosis: NEGATIVE
High risk HPV: NEGATIVE

## 2019-02-17 ENCOUNTER — Telehealth: Payer: Self-pay | Admitting: Obstetrics & Gynecology

## 2019-02-17 DIAGNOSIS — K219 Gastro-esophageal reflux disease without esophagitis: Secondary | ICD-10-CM

## 2019-02-17 NOTE — Telephone Encounter (Signed)
Spoke to pt. Pt went to Dr Lorie Apley office yesterday and "didn't have a good experience" and wanted Dr Ammie Ferrier recommendations on another GI Dr referral for her.   Routing to Dr Sabra Heck for recommendations.

## 2019-02-17 NOTE — Telephone Encounter (Signed)
Patient was given a referral to Dr.Mann. She is asking for another referral "outside of Dr.Mann".

## 2019-02-17 NOTE — Telephone Encounter (Signed)
I would suggest Dr. Silvano Rusk at [facility] or Clarene Essex at [place].  I can place referral if she desires.  Thanks.

## 2019-02-20 NOTE — Telephone Encounter (Signed)
Spoke back with pt. Pt given suggestions for GI. Pt has chosen Dr Carlean Purl with [facility]. Referral placed.   Routing to Dr Sabra Heck for review and will close encounter.  Cc: Rosa for referral.

## 2019-02-20 NOTE — Telephone Encounter (Signed)
Left message for pt to call back to triage RN Colletta Maryland.

## 2019-03-29 ENCOUNTER — Encounter: Payer: Self-pay | Admitting: Certified Nurse Midwife

## 2019-05-10 ENCOUNTER — Telehealth: Payer: Self-pay | Admitting: Obstetrics & Gynecology

## 2019-05-10 NOTE — Telephone Encounter (Signed)
Patient has not returned calls for scheduling Gasterenterology referral. Per Dr. Sabra Heck the referral has been closed.

## 2020-02-17 ENCOUNTER — Other Ambulatory Visit: Payer: Self-pay | Admitting: Obstetrics & Gynecology

## 2020-02-28 ENCOUNTER — Other Ambulatory Visit (HOSPITAL_BASED_OUTPATIENT_CLINIC_OR_DEPARTMENT_OTHER): Payer: Self-pay | Admitting: Obstetrics & Gynecology

## 2020-02-28 ENCOUNTER — Telehealth (HOSPITAL_BASED_OUTPATIENT_CLINIC_OR_DEPARTMENT_OTHER): Payer: Self-pay | Admitting: *Deleted

## 2020-02-28 MED ORDER — ESTRADIOL 1 MG PO TABS: 1.0000 mg | ORAL_TABLET | Freq: Every day | ORAL | 0 refills | Status: DC

## 2020-02-28 MED ORDER — PROGESTERONE 200 MG PO CAPS: 200.0000 mg | ORAL_CAPSULE | Freq: Every day | ORAL | 0 refills | Status: DC

## 2020-02-28 MED FILL — ESTRADIOL 1 MG TABS: 1 | 90 days supply | Qty: 90 | Fill #0

## 2020-02-28 MED FILL — PROGESTERONE 200 MG CAPS: 200 | 90 days supply | Qty: 90 | Fill #0

## 2020-02-28 NOTE — Telephone Encounter (Signed)
LMOVM that Rx had been sent to her pharmacy.

## 2020-02-28 NOTE — Telephone Encounter (Signed)
Rx has been sent to pharmacy.  She has appt in April so I did 90 day supply for her.  Please let her know.  Thanks.

## 2020-02-28 NOTE — Telephone Encounter (Signed)
Pt called requesting refills on estradiol and progesterone to be sent to [facility] on [address].

## 2020-04-05 ENCOUNTER — Ambulatory Visit: Payer: Commercial Managed Care - PPO

## 2020-04-11 ENCOUNTER — Ambulatory Visit (HOSPITAL_BASED_OUTPATIENT_CLINIC_OR_DEPARTMENT_OTHER): Payer: Commercial Managed Care - PPO | Admitting: Obstetrics & Gynecology

## 2020-05-31 ENCOUNTER — Other Ambulatory Visit (HOSPITAL_BASED_OUTPATIENT_CLINIC_OR_DEPARTMENT_OTHER): Payer: Self-pay | Admitting: Obstetrics & Gynecology

## 2020-06-18 ENCOUNTER — Ambulatory Visit (HOSPITAL_BASED_OUTPATIENT_CLINIC_OR_DEPARTMENT_OTHER): Payer: Commercial Managed Care - PPO | Admitting: Obstetrics & Gynecology

## 2020-09-20 ENCOUNTER — Ambulatory Visit (HOSPITAL_BASED_OUTPATIENT_CLINIC_OR_DEPARTMENT_OTHER): Payer: Commercial Managed Care - PPO | Admitting: Obstetrics & Gynecology

## 2020-09-26 ENCOUNTER — Ambulatory Visit (INDEPENDENT_AMBULATORY_CARE_PROVIDER_SITE_OTHER): Payer: Commercial Managed Care - PPO | Admitting: Obstetrics & Gynecology

## 2020-09-26 ENCOUNTER — Other Ambulatory Visit: Payer: Self-pay

## 2020-09-26 ENCOUNTER — Encounter (HOSPITAL_BASED_OUTPATIENT_CLINIC_OR_DEPARTMENT_OTHER): Payer: Self-pay | Admitting: Obstetrics & Gynecology

## 2020-09-26 ENCOUNTER — Other Ambulatory Visit (HOSPITAL_COMMUNITY)
Admission: RE | Admit: 2020-09-26 | Discharge: 2020-09-26 | Disposition: A | Discharge status: Home / Self Care | Payer: Commercial Managed Care - PPO | Source: Ambulatory Visit | Attending: Obstetrics & Gynecology | Admitting: Obstetrics & Gynecology

## 2020-09-26 VITALS — BP 113/76 | HR 66 | Ht 64.5 in | Wt 131.2 lb

## 2020-09-26 DIAGNOSIS — Z1211 Encounter for screening for malignant neoplasm of colon: Secondary | ICD-10-CM | POA: Diagnosis not present

## 2020-09-26 DIAGNOSIS — R5383 Other fatigue: Secondary | ICD-10-CM | POA: Diagnosis not present

## 2020-09-26 DIAGNOSIS — Z1159 Encounter for screening for other viral diseases: Secondary | ICD-10-CM | POA: Diagnosis not present

## 2020-09-26 DIAGNOSIS — K219 Gastro-esophageal reflux disease without esophagitis: Secondary | ICD-10-CM

## 2020-09-26 DIAGNOSIS — N898 Other specified noninflammatory disorders of vagina: Secondary | ICD-10-CM | POA: Insufficient documentation

## 2020-09-26 DIAGNOSIS — Z01419 Encounter for gynecological examination (general) (routine) without abnormal findings: Secondary | ICD-10-CM

## 2020-09-26 MED ORDER — PANTOPRAZOLE SODIUM 40 MG PO TBEC: 40.0000 mg | DELAYED_RELEASE_TABLET | Freq: Every day | ORAL | 3 refills | Status: DC

## 2020-09-26 MED ORDER — ESTRADIOL 1 MG PO TABS: 1.0000 mg | ORAL_TABLET | Freq: Every day | ORAL | 3 refills | Status: DC

## 2020-09-26 MED ORDER — PROGESTERONE 200 MG PO CAPS: 200.0000 mg | ORAL_CAPSULE | Freq: Every day | ORAL | 3 refills | Status: DC

## 2020-09-26 NOTE — Progress Notes (Signed)
50 y.o. G0P0000 Married White or Caucasian female here for annual exam.  Doing well.  She does skip cycles.  Pt reports having vaginal odor that has been present for some time.  No significant discharge.  Does have some fatigue as well.  Knows some of this may be due to now being 82.  We discussed some other lab tests to consider.  No LMP recorded. Patient is perimenopausal.          Sexually active: Yes.    The current method of family planning is none.    Exercising: Yes.    Smoker:  no  Health Maintenance: Pap:  01/20/2019 Negative History of abnormal Pap:  LEEP 1998 MMG:  11/10/2018 Negative.  Aware this is due. Colonoscopy:  declines but willing to do a cologuard TDaP:  2013 Hep C testing: today Screening Labs: additional labs ordered   reports that she has never smoked. She has never used smokeless tobacco. She reports that she does not drink alcohol and does not use drugs.  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   IBS (irritable bowel syndrome)     Past Surgical History:  Procedure Laterality Date   BREAST BIOPSY Right 08/10/2012   Procedure: NEEDLE LOCALIZATION REMOVAL RIGHT BREAST CALCIFICATIONS;  Surgeon: Haywood Lasso, MD;  Location: [facility];  Service: General;  Laterality: Right;   BREAST EXCISIONAL BIOPSY Right 2014   Benign    EAR EXAMINATION UNDER ANESTHESIA  1994   left   LEEP  1998   pathology not in [place] - will do pap X 20 years   OOPHORECTOMY Right 1998   benign tumor    Current Outpatient Medications  Medication Sig Dispense Refill   Docusate Calcium (STOOL SOFTENER PO) Take by mouth 2 (two) times daily.     pantoprazole (PROTONIX) 40 MG tablet Take 1 tablet (40 mg total) by mouth daily. 30 tablet 1   estradiol (ESTRACE) 1 MG tablet Take 1 tablet (1 mg total) by mouth daily. 90 tablet 3   Multiple Vitamins-Minerals (MULTIVITAMIN WITH MINERALS) tablet Take 1 tablet by mouth daily. (Patient not  taking: Reported on 09/26/2020)     progesterone (PROMETRIUM) 200 MG capsule Take 1 capsule (200 mg total) by mouth daily. 45 capsule 3   No current facility-administered medications for this visit.    Family History  Problem Relation Age of Onset   Hyperlipidemia Mother    Hypertension Mother    Stroke Mother    Diabetes Father    Cancer Maternal Grandfather 77       esophageal   Stroke Maternal Grandmother    Breast cancer Neg Hx     Review of Systems  All other systems reviewed and are negative.  Exam:   BP 113/76 (BP Location: Right Arm, Patient Position: Sitting, Cuff Size: Small)   Pulse 66   Ht 5' 4.5" (1.638 m)   Wt 131 lb 3.2 oz (59.5 kg)   BMI 22.17 kg/m   Height: 5' 4.5" (163.8 cm)  General appearance: alert, cooperative and appears stated age Head: Normocephalic, without obvious abnormality, atraumatic Neck: no adenopathy, supple, symmetrical, trachea midline and thyroid normal to inspection and palpation Lungs: clear to auscultation bilaterally Breasts: normal appearance, no masses or tenderness Heart: regular rate and rhythm Abdomen: soft, non-tender; bowel sounds normal; no masses,  no organomegaly Extremities: extremities normal, atraumatic, no cyanosis or edema Skin: Skin color, texture, turgor normal. No rashes  or lesions Lymph nodes: Cervical, supraclavicular, and axillary nodes normal. No abnormal inguinal nodes palpated Neurologic: Grossly normal  Pelvic: External genitalia:  no lesions              Urethra:  normal appearing urethra with no masses, tenderness or lesions              Bartholins and Skenes: normal                 Vagina: normal appearing vagina with normal color and no discharge, no lesions              Cervix: no lesions              Pap taken: No. Bimanual Exam:  Uterus:  normal size, contour, position, consistency, mobility, non-tender              Adnexa: normal adnexa and no mass, fullness, tenderness                Rectovaginal: Declines Anus: no lesions  Chaperone, Octaviano Batty, CMA, was present for exam.  Assessment/Plan: 1. Well woman exam with routine gynecological exam - pap 01/2019 neg with neg HR HPV.  Not indicated today. - MMG 11/2018.  Aware this is due. - Colonoscopy guidelines reviewed.  She declined but is willing to do a cologuard - care gaps reviewed/updated  2. Other fatigue - TSH - B12 - CBC  3. Colon cancer screening - Cologuard  4. Gastroesophageal reflux disease without esophagitis - pantoprazole (PROTONIX) 40 MG tablet; Take 1 tablet (40 mg total) by mouth daily.  Dispense: 30 tablet; Refill: 3 - Ambulatory referral to Gastroenterology  5. Vaginal odor - Cervicovaginal ancillary only( [facility])  6. Encounter for hepatitis C screening test for low risk patient - Hepatitis C antibody

## 2020-09-27 ENCOUNTER — Other Ambulatory Visit (HOSPITAL_BASED_OUTPATIENT_CLINIC_OR_DEPARTMENT_OTHER): Payer: Self-pay | Admitting: Obstetrics & Gynecology

## 2020-09-27 LAB — CERVICOVAGINAL ANCILLARY ONLY
Bacterial Vaginitis (gardnerella): NEGATIVE
Candida Glabrata: POSITIVE — AB
Candida Vaginitis: NEGATIVE
Comment: NEGATIVE
Comment: NEGATIVE
Comment: NEGATIVE

## 2020-09-27 LAB — CBC
Hematocrit: 41.4 % (ref 34.0–46.6)
Hemoglobin: 14.1 g/dL (ref 11.1–15.9)
MCH: 29.6 pg (ref 26.6–33.0)
MCHC: 34.1 g/dL (ref 31.5–35.7)
MCV: 87 fL (ref 79–97)
Platelets: 373 10*3/uL (ref 150–450)
RBC: 4.76 x10E6/uL (ref 3.77–5.28)
RDW: 13 % (ref 11.7–15.4)
WBC: 4.3 10*3/uL (ref 3.4–10.8)

## 2020-09-27 LAB — TSH: TSH: 3.09 u[IU]/mL (ref 0.450–4.500)

## 2020-09-27 LAB — HEPATITIS C ANTIBODY: Hep C Virus Ab: <0.1 s/co ratio (ref 0.0–0.9)

## 2020-09-27 LAB — VITAMIN B12: Vitamin B-12: 600 pg/mL (ref 232–1245)

## 2020-09-29 DIAGNOSIS — R5383 Other fatigue: Secondary | ICD-10-CM | POA: Insufficient documentation

## 2020-09-29 DIAGNOSIS — K219 Gastro-esophageal reflux disease without esophagitis: Secondary | ICD-10-CM | POA: Insufficient documentation

## 2020-10-01 ENCOUNTER — Telehealth (HOSPITAL_BASED_OUTPATIENT_CLINIC_OR_DEPARTMENT_OTHER): Payer: Self-pay | Admitting: Obstetrics & Gynecology

## 2020-10-01 NOTE — Telephone Encounter (Signed)
Patient called and left a message for nurse to call her back about medication refill.

## 2020-10-01 NOTE — Telephone Encounter (Signed)
Patient states that she received a message stating [facility] could compound the medication recommended for yeast. She wanted to let us know that [facility] is fine with her. We can call medication in, so that she can go pick it up.

## 2020-10-02 ENCOUNTER — Other Ambulatory Visit (HOSPITAL_BASED_OUTPATIENT_CLINIC_OR_DEPARTMENT_OTHER): Payer: Self-pay | Admitting: Obstetrics & Gynecology

## 2020-10-02 MED ORDER — NONFORMULARY OR COMPOUNDED ITEM: 0 refills | Status: DC

## 2020-10-10 ENCOUNTER — Encounter (HOSPITAL_BASED_OUTPATIENT_CLINIC_OR_DEPARTMENT_OTHER): Payer: Self-pay | Admitting: *Deleted

## 2020-10-17 ENCOUNTER — Encounter: Payer: Self-pay | Admitting: Gastroenterology

## 2020-10-25 ENCOUNTER — Telehealth (HOSPITAL_BASED_OUTPATIENT_CLINIC_OR_DEPARTMENT_OTHER): Payer: Self-pay | Admitting: Obstetrics & Gynecology

## 2020-10-25 ENCOUNTER — Other Ambulatory Visit (HOSPITAL_BASED_OUTPATIENT_CLINIC_OR_DEPARTMENT_OTHER): Payer: Self-pay | Admitting: *Deleted

## 2020-10-25 NOTE — Telephone Encounter (Signed)
Patient called and left a message to please have the nurse give her a call to follow about medication.

## 2020-10-25 NOTE — Telephone Encounter (Signed)
Pt states that she completed treatment of boric acid that Dr. Sabra Heck had prescribed. She states that her symptoms had disappeared after that but now they have returned. Pt advised to come to office for swab to test again. Pt given appt.

## 2020-10-27 LAB — COLOGUARD: Cologuard: NEGATIVE

## 2020-10-28 ENCOUNTER — Ambulatory Visit (INDEPENDENT_AMBULATORY_CARE_PROVIDER_SITE_OTHER): Payer: Commercial Managed Care - PPO | Admitting: *Deleted

## 2020-10-28 ENCOUNTER — Other Ambulatory Visit: Payer: Self-pay

## 2020-10-28 ENCOUNTER — Other Ambulatory Visit (HOSPITAL_COMMUNITY)
Admission: RE | Admit: 2020-10-28 | Discharge: 2020-10-28 | Disposition: A | Discharge status: Home / Self Care | Payer: Commercial Managed Care - PPO | Source: Ambulatory Visit | Attending: Obstetrics & Gynecology | Admitting: Obstetrics & Gynecology

## 2020-10-28 DIAGNOSIS — N898 Other specified noninflammatory disorders of vagina: Secondary | ICD-10-CM | POA: Diagnosis present

## 2020-10-28 NOTE — Progress Notes (Signed)
Pt still having vaginal discharge after last treatment. Self-swab done. Will notify pt of results.

## 2020-10-29 LAB — CERVICOVAGINAL ANCILLARY ONLY
Bacterial Vaginitis (gardnerella): POSITIVE — AB
Candida Glabrata: NEGATIVE
Candida Vaginitis: NEGATIVE
Comment: NEGATIVE
Comment: NEGATIVE
Comment: NEGATIVE

## 2020-10-31 ENCOUNTER — Telehealth (HOSPITAL_BASED_OUTPATIENT_CLINIC_OR_DEPARTMENT_OTHER): Payer: Self-pay | Admitting: Obstetrics & Gynecology

## 2020-10-31 NOTE — Telephone Encounter (Signed)
Patient called and left a message for the nurse to call her to go over her result from Monday.

## 2020-11-01 ENCOUNTER — Telehealth (HOSPITAL_BASED_OUTPATIENT_CLINIC_OR_DEPARTMENT_OTHER): Payer: Self-pay | Admitting: Obstetrics & Gynecology

## 2020-11-01 MED ORDER — METRONIDAZOLE 500 MG PO TABS: 500.0000 mg | ORAL_TABLET | Freq: Two times a day (BID) | ORAL | 0 refills | Status: DC

## 2020-11-01 NOTE — Telephone Encounter (Signed)
DOB verified.  Informed pt that aptima swab was positive for BV. Advised that Rx would be sent to pharmacy. Advised to take twice daily for 7 days. Advised to not consume alcohol while taking. Pt verbalized understanding.

## 2020-11-01 NOTE — Telephone Encounter (Signed)
Patient called and left a message stating that she is  returning Maudie Mercury the nurse call .

## 2020-11-12 ENCOUNTER — Ambulatory Visit (INDEPENDENT_AMBULATORY_CARE_PROVIDER_SITE_OTHER): Payer: Commercial Managed Care - PPO | Admitting: Gastroenterology

## 2020-11-12 ENCOUNTER — Other Ambulatory Visit (HOSPITAL_BASED_OUTPATIENT_CLINIC_OR_DEPARTMENT_OTHER): Payer: Self-pay | Admitting: Obstetrics & Gynecology

## 2020-11-12 ENCOUNTER — Encounter: Payer: Self-pay | Admitting: Gastroenterology

## 2020-11-12 VITALS — BP 110/70 | HR 75 | Ht 64.5 in | Wt 132.5 lb

## 2020-11-12 DIAGNOSIS — R140 Abdominal distension (gaseous): Secondary | ICD-10-CM

## 2020-11-12 DIAGNOSIS — R1013 Epigastric pain: Secondary | ICD-10-CM

## 2020-11-12 NOTE — Progress Notes (Signed)
[facility] Consult Note:  History: Elaine Edwards 11/12/2020  Referring provider: Shon Baton, MD  Reason for consult/chief complaint: Gastroesophageal Reflux (For Years /Did cologuard 3 weeks ago-negative)   Subjective  HPI:  Elaine Edwards was self-referred for chronic upper digestive symptoms and concern for reflux.  Several years ago she started getting an upper abdominal pressure and bloating that would be relieved with belching sometimes resulting in regurgitation of small food particles or liquid.  She has a globus sensation and throat clearing.  Denies nausea vomiting or heartburn or dysphagia.  Some things will trigger the symptoms such as certain foods, sugar and spices, if she drinks lots of water, or sometimes just a mint flavored substance such as toothpaste.  Protonix has been a little help and she took it consistently years ago then stopped and recently resumed it.  She had some concerns because her grandfather had esophageal cancer and she has never had this condition checked out. No change in bowel habits or rectal bleeding She also reports "I tend to keep my stress in my stomach".  ROS:  Review of Systems  Constitutional:  Negative for appetite change and unexpected weight change.  HENT:  Negative for mouth sores and voice change.   Eyes:  Negative for pain and redness.  Respiratory:  Negative for cough and shortness of breath.   Cardiovascular:  Negative for chest pain and palpitations.  Genitourinary:  Negative for dysuria and hematuria.  Musculoskeletal:  Negative for arthralgias and myalgias.  Skin:  Negative for pallor and rash.  Neurological:  Negative for weakness and headaches.  Hematological:  Negative for adenopathy.    Past Medical History: 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   IBS (irritable bowel syndrome)      Past Surgical History: Past  Surgical History:  Procedure Laterality Date   BREAST BIOPSY Right 08/10/2012   Procedure: NEEDLE LOCALIZATION REMOVAL RIGHT BREAST CALCIFICATIONS;  Surgeon: Haywood Lasso, MD;  Location: [facility];  Service: General;  Laterality: Right;   BREAST EXCISIONAL BIOPSY Right 2014   Benign    EAR EXAMINATION UNDER ANESTHESIA  1994   left   LEEP  1998   pathology not in [place] - will do pap X 20 years   OOPHORECTOMY Right 1998   benign tumor     Family History: Family History  Problem Relation Age of Onset   Hyperlipidemia Mother    Hypertension Mother    Stroke Mother    Diabetes Father    Stroke Maternal Grandmother    Cancer Maternal Grandfather 60       esophageal   Esophageal cancer Maternal Grandfather    Breast cancer Neg Hx    Colon cancer Neg Hx     Social History: Social History   Socioeconomic History   Marital status: Married    Spouse name: Not on file   Number of children: 0   Years of education: Not on file   Highest education level: Not on file  Occupational History   Not on file  Tobacco Use   Smoking status: Never   Smokeless tobacco: Never  Vaping Use   Vaping Use: Never used  Substance and Sexual Activity   Alcohol use: No   Drug use: No   Sexual activity: Yes    Birth control/protection: Pill  Other Topics Concern   Not on file  Social History  Narrative   Not on file   Social Determinants of Health   Financial Resource Strain: Not on file  Food Insecurity: Not on file  Transportation Needs: Not on file  Physical Activity: Not on file  Stress: Not on file  Social Connections: Not on file  Works in Pharmacologist for [employer]  Allergies: No Known Allergies  Outpatient Meds: Current Outpatient Medications  Medication Sig Dispense Refill   Docusate Calcium (STOOL SOFTENER PO) Take 1 tablet by mouth 2 (two) times daily.     Multiple Vitamins-Minerals (MULTIVITAMIN WITH MINERALS) tablet Take 1 tablet by mouth daily.      pantoprazole (PROTONIX) 40 MG tablet Take 1 tablet (40 mg total) by mouth daily. 30 tablet 3   progesterone (PROMETRIUM) 200 MG capsule Take 1 capsule (200 mg total) by mouth daily. 45 capsule 3   estradiol (ESTRACE) 1 MG tablet Take 1 tablet (1 mg total) by mouth daily. 90 tablet 3   No current facility-administered medications for this visit.      ___________________________________________________________________ Objective   Exam:  BP 110/70   Pulse 75   Ht 5' 4.5" (1.638 m)   Wt 132 lb 8 oz (60.1 kg)   BMI 22.39 kg/m  Wt Readings from Last 3 Encounters:  11/12/20 132 lb 8 oz (60.1 kg)  09/26/20 131 lb 3.2 oz (59.5 kg)  01/20/19 129 lb (58.5 kg)    General: Well-appearing Eyes: sclera anicteric, no redness ENT: oral mucosa moist without lesions, no cervical or supraclavicular lymphadenopathy CV: RRR without murmur, S1/S2, no JVD, no peripheral edema Resp: clear to auscultation bilaterally, normal RR and effort noted GI: soft, no tenderness, with active bowel sounds. No guarding or palpable organomegaly noted. Skin; warm and dry, no rash or jaundice noted Neuro: awake, alert and oriented x 3. Normal gross motor function and fluent speech  Labs:  Negative cologuard last month  CBC Latest Ref Rng & Units 09/26/2020 01/20/2019 07/30/2017  WBC 3.4 - 10.8 x10E3/uL 4.3 5.6 6.5  Hemoglobin 11.1 - 15.9 g/dL 14.1 14.1 12.4  Hematocrit 34.0 - 46.6 % 41.4 42.1 35.5(L)  Platelets 150 - 450 x10E3/uL 373 368 380     Assessment: Encounter Diagnoses  Name Primary?   Epigastric pain Yes   Abdominal bloating     Dyspeptic symptoms with bloating and pressure leading to belching and what may be regurgitation, but without heartburn.  Not clear if this is truly reflux, versus gastritis or functional dyspepsia.  Not much improvement with PPI, no dysphagia.  Overall she has a healthy diet and lifestyle.  Plan:  Upper endoscopy.  She was agreeable after discussion of procedure and  risks  The benefits and risks of the planned procedure were described in detail with the patient or (when appropriate) their health care proxy.  Risks were outlined as including, but not limited to, bleeding, infection, perforation, adverse medication reaction leading to cardiac or pulmonary decompensation, pancreatitis (if ERCP).  The limitation of incomplete mucosal visualization was also discussed.  No guarantees or warranties were given.  No change to medicine at this point  Thank you for the courtesy of this consult.  Please call me with any questions or concerns.  Nelida Meuse III  CC: Referring provider noted above

## 2020-11-12 NOTE — Patient Instructions (Signed)
If you are age 50 or older, your body mass index should be between 23-30. Your Body mass index is 22.39 kg/m. If this is out of the aforementioned range listed, please consider follow up with your Primary Care Provider.  If you are age 87 or younger, your body mass index should be between 19-25. Your Body mass index is 22.39 kg/m. If this is out of the aformentioned range listed, please consider follow up with your Primary Care Provider.   ________________________________________________________  The [facility] providers would like to encourage you to use [facility] to communicate with providers for non-urgent requests or questions.  Due to long hold times on the telephone, sending your provider a message by [facility] may be a faster and more efficient way to get a response.  Please allow 48 business hours for a response.  Please remember that this is for non-urgent requests.  _______________________________________________________  Elaine Edwards have been scheduled for an endoscopy. Please follow written instructions given to you at your visit today. If you use inhalers (even only as needed), please bring them with you on the day of your procedure.  It was a pleasure to see you today!  Thank you for trusting me with your gastrointestinal care!

## 2020-11-27 ENCOUNTER — Other Ambulatory Visit (HOSPITAL_BASED_OUTPATIENT_CLINIC_OR_DEPARTMENT_OTHER): Payer: Self-pay | Admitting: Obstetrics & Gynecology

## 2020-11-27 DIAGNOSIS — K219 Gastro-esophageal reflux disease without esophagitis: Secondary | ICD-10-CM

## 2020-12-02 ENCOUNTER — Encounter: Payer: Self-pay | Admitting: Gastroenterology

## 2020-12-05 ENCOUNTER — Ambulatory Visit (AMBULATORY_SURGERY_CENTER): Payer: Commercial Managed Care - PPO | Admitting: Gastroenterology

## 2020-12-05 ENCOUNTER — Encounter: Payer: Self-pay | Admitting: Gastroenterology

## 2020-12-05 VITALS — BP 115/79 | HR 60 | Temp 97.5°F | Resp 10 | Ht 64.0 in | Wt 132.0 lb

## 2020-12-05 DIAGNOSIS — R142 Eructation: Secondary | ICD-10-CM

## 2020-12-05 DIAGNOSIS — R140 Abdominal distension (gaseous): Secondary | ICD-10-CM

## 2020-12-05 DIAGNOSIS — R1013 Epigastric pain: Secondary | ICD-10-CM

## 2020-12-05 MED ORDER — SODIUM CHLORIDE 0.9 % IV SOLN: 500.0000 mL | Freq: Once | INTRAVENOUS | Status: DC

## 2020-12-05 NOTE — Progress Notes (Signed)
Report to PACU, RN, vss, BBS= Clear.  

## 2020-12-05 NOTE — Op Note (Signed)
[facility] Patient Name: Elaine Edwards Procedure Date: 12/05/2020 3:38 PM MRN: [id number] Endoscopist: Mallie Mussel L. Loletha Carrow , MD Age: 50 Referring MD:  Date of Birth: 08-20-1970 Gender: Female Account #: [id number] Procedure:                Upper GI endoscopy Indications:              Abdominal bloating, Eructation, Sore throat Medicines:                Monitored Anesthesia Care Procedure:                Pre-Anesthesia Assessment:                           - Prior to the procedure, a History and Physical                            was performed, and patient medications and                            allergies were reviewed. The patient's tolerance of                            previous anesthesia was also reviewed. The risks                            and benefits of the procedure and the sedation                            options and risks were discussed with the patient.                            All questions were answered, and informed consent                            was obtained. Prior Anticoagulants: The patient has                            taken no previous anticoagulant or antiplatelet                            agents. ASA Grade Assessment: II - A patient with                            mild systemic disease. After reviewing the risks                            and benefits, the patient was deemed in                            satisfactory condition to undergo the procedure.                           After obtaining informed consent, the endoscope was  passed under direct vision. Throughout the                            procedure, the patient's blood pressure, pulse, and                            oxygen saturations were monitored continuously. The                            Endoscope was introduced through the mouth, and                            advanced to the second part of duodenum. The upper                            GI  endoscopy was accomplished without difficulty.                            The patient tolerated the procedure well. Scope In: Scope Out: Findings:                 The larynx was normal.                           The esophagus was normal.                           The stomach was normal.                           The cardia and gastric fundus were normal on                            retroflexion.                           The examined duodenum was normal. Complications:            No immediate complications. Estimated Blood Loss:     Estimated blood loss: none. Impression:               - Normal larynx.                           - Normal esophagus.                           - Normal stomach.                           - Normal examined duodenum.                           - No specimens collected.                           Functional dyspepsia. Recommendation:           - Patient has a contact number available for  emergencies. The signs and symptoms of potential                            delayed complications were discussed with the                            patient. Return to normal activities tomorrow.                            Written discharge instructions were provided to the                            patient.                           - Resume previous diet. Stop PPI medicine if it has                            not been relieving symptoms.                           - Continue present medications.                           - Natural therapy such as ginger or "FD gard" may                            be helpful. [name] L. Loletha Carrow, MD 12/05/2020 4:12:03 PM This report has been signed electronically.

## 2020-12-05 NOTE — Progress Notes (Signed)
No changes to clinical history since GI office visit on 11/12/20.  The patient is appropriate for an endoscopic procedure in the ambulatory setting.

## 2020-12-05 NOTE — Patient Instructions (Signed)
Read all of the handouts given to you by your recovery room nurse.   Resume your medications.  You might want to try FD gard. It could be helpful.  YOU HAD AN ENDOSCOPIC PROCEDURE TODAY AT [facility]:   Refer to the procedure report that was given to you for any specific questions about what was found during the examination.  If the procedure report does not answer your questions, please call your gastroenterologist to clarify.  If you requested that your care partner not be given the details of your procedure findings, then the procedure report has been included in a sealed envelope for you to review at your convenience later.  YOU SHOULD EXPECT: Some feelings of bloating in the abdomen. Passage of more gas than usual.  Walking can help get rid of the air that was put into your GI tract during the procedure and reduce the bloating.   Please Note:  You might notice some irritation and congestion in your nose or some drainage.  This is from the oxygen used during your procedure.  There is no need for concern and it should clear up in a day or so.  SYMPTOMS TO REPORT IMMEDIATELY:   Following upper endoscopy (EGD)  Vomiting of blood or coffee ground material  New chest pain or pain under the shoulder blades  Painful or persistently difficult swallowing  New shortness of breath  Fever of 100F or higher  Black, tarry-looking stools  For urgent or emergent issues, a gastroenterologist can be reached at any hour by calling [phone]. Do not use MyChart messaging for urgent concerns.    DIET:  We do recommend a small meal at first, but then you may proceed to your regular diet.  Drink plenty of fluids but you should avoid alcoholic beverages for 24 hours.  ACTIVITY:  You should plan to take it easy for the rest of today and you should NOT DRIVE or use heavy machinery until tomorrow (because of the sedation medicines used during the test).    FOLLOW UP: Our staff will call  the number listed on your records 48-72 hours following your procedure to check on you and address any questions or concerns that you may have regarding the information given to you following your procedure. If we do not reach you, we will leave a message.  We will attempt to reach you two times.  During this call, we will ask if you have developed any symptoms of COVID 19. If you develop any symptoms (ie: fever, flu-like symptoms, shortness of breath, cough etc.) before then, please call [phone].  If you test positive for Covid 19 in the 2 weeks post procedure, please call and report this information to [place].     SIGNATURES/CONFIDENTIALITY: You and/or your care partner have signed paperwork which will be entered into your electronic medical record.  These signatures attest to the fact that that the information above on your After Visit Summary has been reviewed and is understood.  Full responsibility of the confidentiality of this discharge information lies with you and/or your care-partner.

## 2020-12-05 NOTE — Progress Notes (Signed)
Vital signs checked by:DT  The patient states no changes in medical or surgical history since pre-visit screening on 11/12/20.

## 2020-12-09 ENCOUNTER — Telehealth: Payer: Self-pay | Admitting: *Deleted

## 2020-12-09 ENCOUNTER — Telehealth: Payer: Self-pay

## 2020-12-09 NOTE — Telephone Encounter (Signed)
No answer, left message to call back later today, B.Shandell Giovanni RN. 

## 2020-12-09 NOTE — Telephone Encounter (Signed)
  Follow up Call-  Call back number 12/05/2020  Post procedure Call Back phone  # [phone]  Permission to leave phone message Yes  Some recent data might be hidden    LMOM to call back with any questions or concerns.  Also, call back if patient has developed fever, respiratory issues or been dx with COVID or had any family members or close contacts diagnosed since her procedure.

## 2020-12-17 ENCOUNTER — Encounter (HOSPITAL_BASED_OUTPATIENT_CLINIC_OR_DEPARTMENT_OTHER): Payer: Self-pay | Admitting: *Deleted

## 2020-12-24 ENCOUNTER — Telehealth (HOSPITAL_BASED_OUTPATIENT_CLINIC_OR_DEPARTMENT_OTHER): Payer: Self-pay | Admitting: *Deleted

## 2020-12-24 NOTE — Telephone Encounter (Signed)
Pt states that she is experiencing the same symptoms she had when her vaginal swab was positive for candida glabrata. She complains of external perineal itching and odor. She denies pain or discharge. Pt requests rx for boric acid be sent to her pharmacy. Advised that I would send her request to Dr. Sabra Heck.

## 2020-12-25 ENCOUNTER — Other Ambulatory Visit (HOSPITAL_BASED_OUTPATIENT_CLINIC_OR_DEPARTMENT_OTHER): Payer: Self-pay | Admitting: Obstetrics & Gynecology

## 2020-12-25 MED ORDER — NONFORMULARY OR COMPOUNDED ITEM: 0 refills | Status: DC

## 2020-12-27 ENCOUNTER — Other Ambulatory Visit (HOSPITAL_BASED_OUTPATIENT_CLINIC_OR_DEPARTMENT_OTHER): Payer: Self-pay | Admitting: Obstetrics & Gynecology

## 2020-12-27 ENCOUNTER — Other Ambulatory Visit (HOSPITAL_BASED_OUTPATIENT_CLINIC_OR_DEPARTMENT_OTHER): Payer: Self-pay | Admitting: *Deleted

## 2020-12-27 MED ORDER — NONFORMULARY OR COMPOUNDED ITEM: 0 refills | Status: DC

## 2021-01-24 ENCOUNTER — Telehealth (HOSPITAL_BASED_OUTPATIENT_CLINIC_OR_DEPARTMENT_OTHER): Payer: Self-pay | Admitting: *Deleted

## 2021-01-24 NOTE — Telephone Encounter (Signed)
Pt called to let us know she finished the 14d boric acid treatment and only had 1 day of symptom free.  Pt questioned another Rx.  She knows she needs another culture but she gets billed $200+ with each culture.  She states this is a very expensive yeast infection. Dr Sabra Heck reviewed chart.  Once culture showed yeast and once culture showed BV.  Didn't do culture in December as that is when Dr Sabra Heck prescribed boric acid.  Dr Sabra Heck and I discussed looking into a culture being cheaper through another lab  therefore I will inquire and let her and the patient know.  Dr Sabra Heck did advised the patient to try a maintenance dose of twice weekly boric acid 600mg  to see if a longer continual treatment will help.  The patient will get the boric acid from [facility] and I will contact her back after finding out culture prices.  The patient was grateful.  Sharrie Rothman CMA

## 2021-05-23 IMAGING — MG DIGITAL SCREENING BILAT W/ TOMO W/ CAD
8 series · 9 of 24 positions shown · non-contrast
Comparison: Previous exam(s).

CLINICAL DATA: Screening.

EXAM:
DIGITAL SCREENING BILATERAL MAMMOGRAM WITH TOMO AND CAD

[R MLO synth-2D]
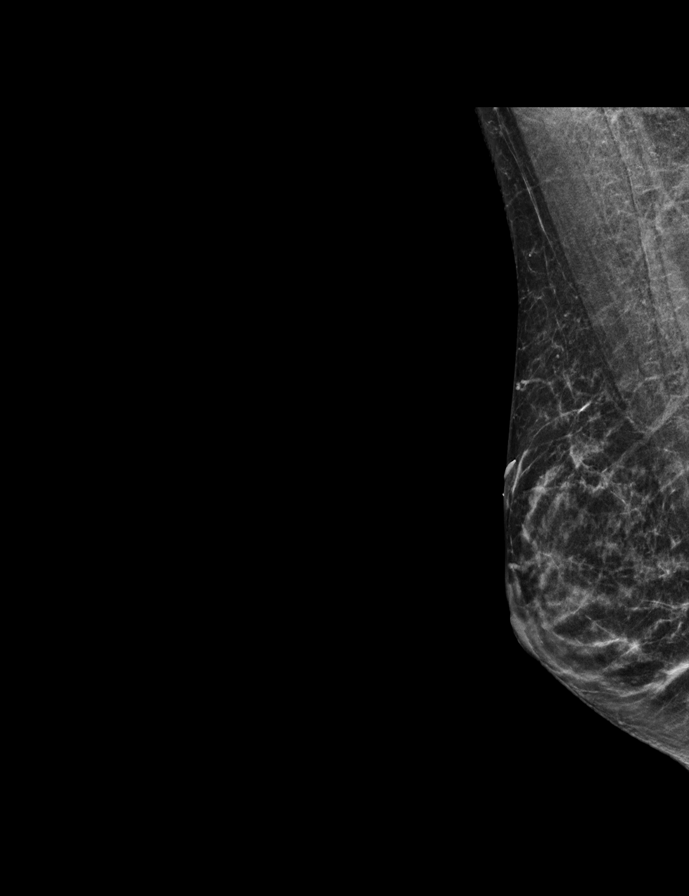

[L MLO synth-2D]
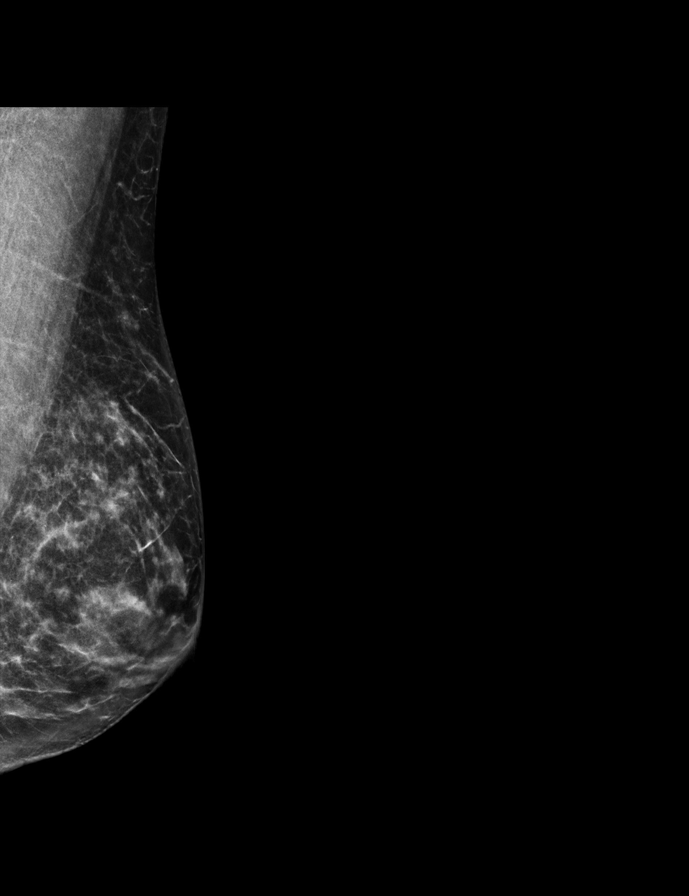

[L CC synth-2D]
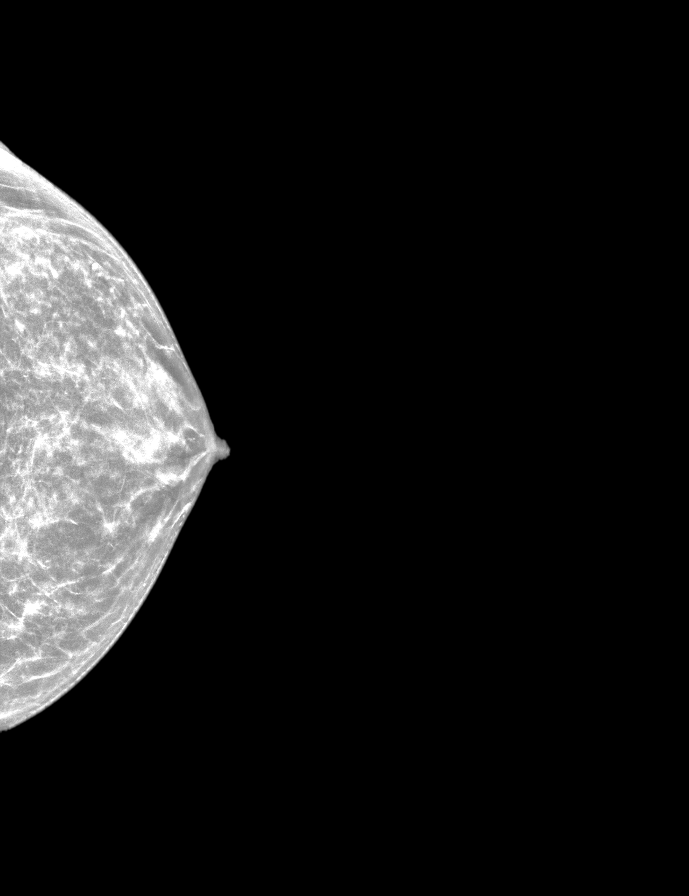

[R CC synth-2D]
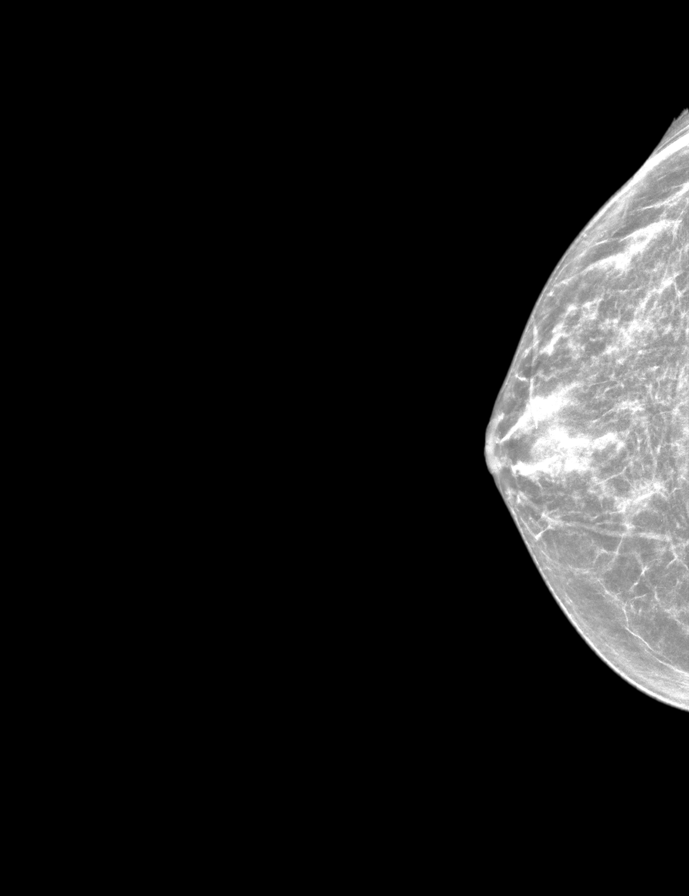

[L CC tomo · 2 of 47 frames shown]
[frame 16/47]
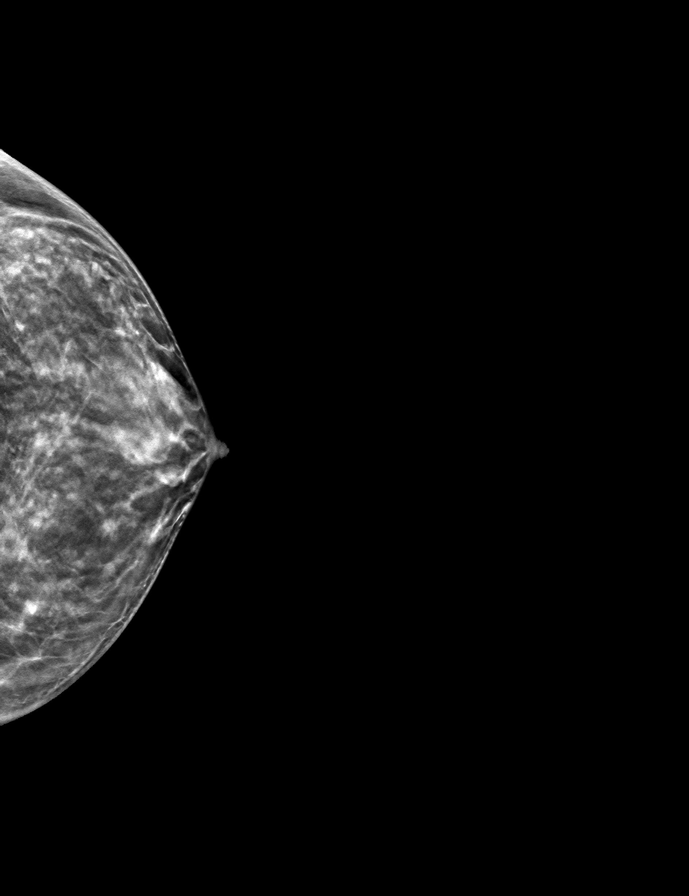
[frame 24/47]
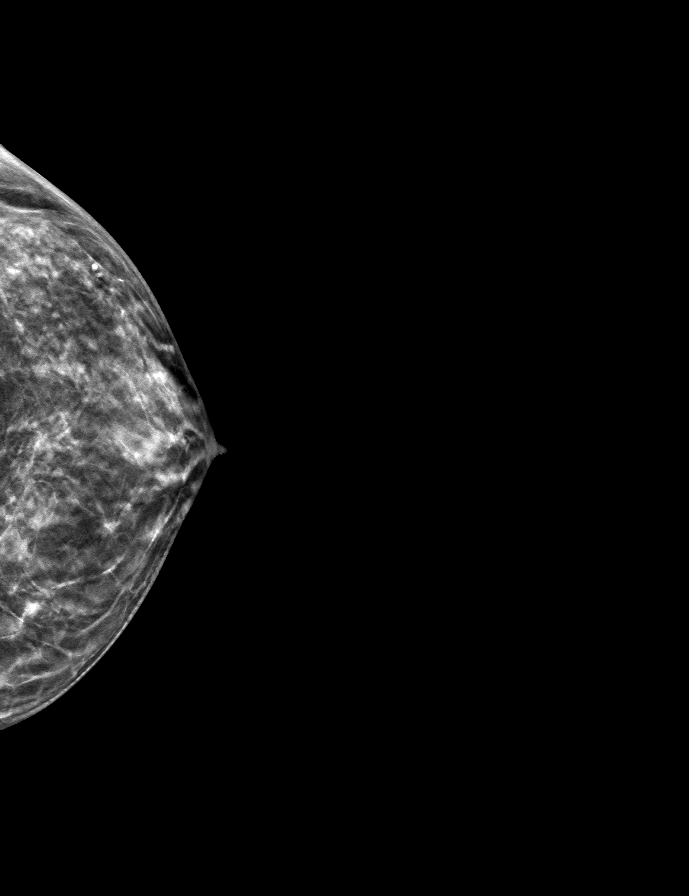

[R CC tomo · tomo slice 23/46.0]
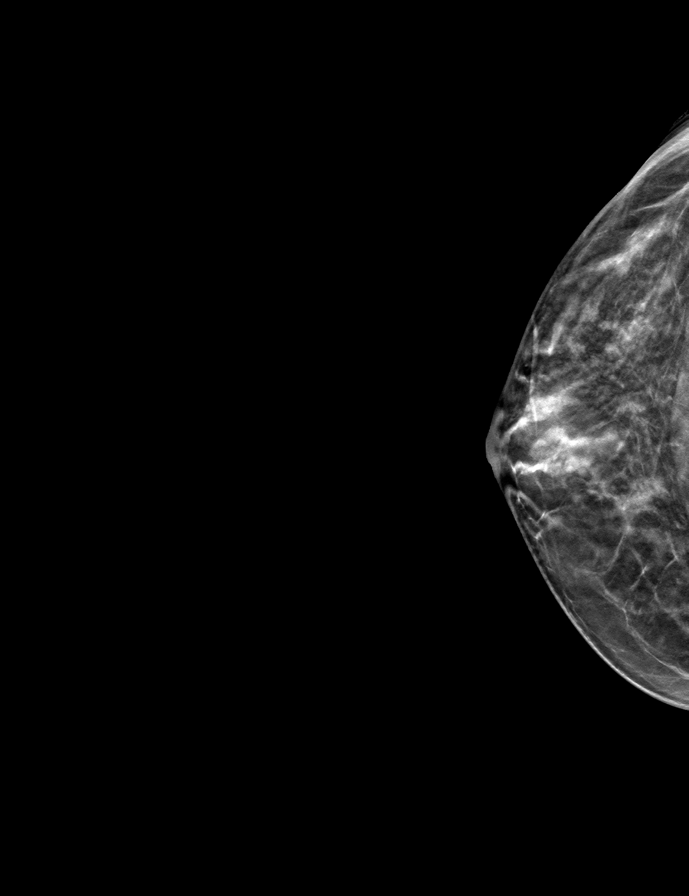

[L MLO tomo · tomo slice 28/55.0]
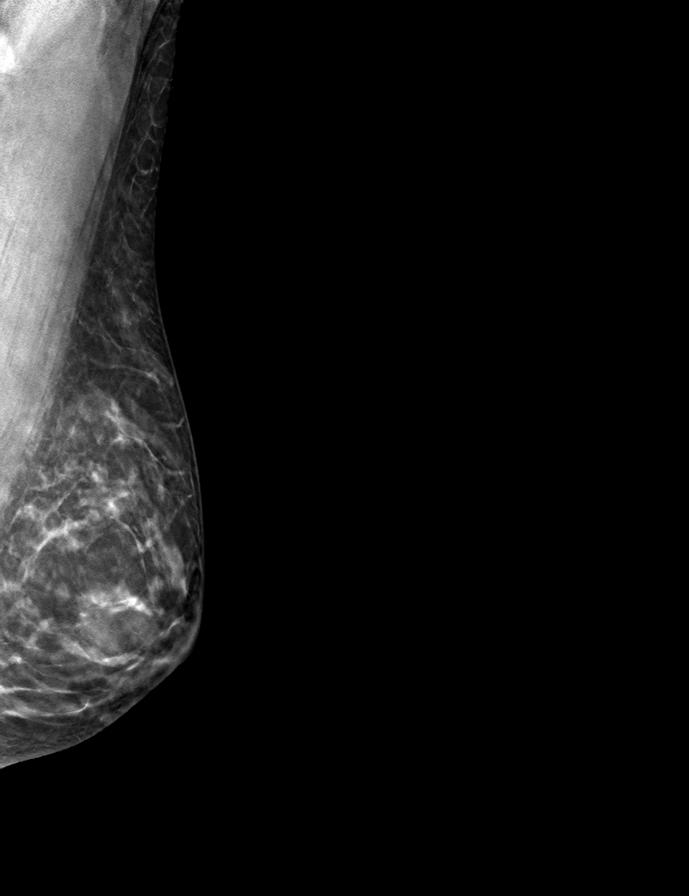

[R MLO tomo · tomo slice 25/48.0]
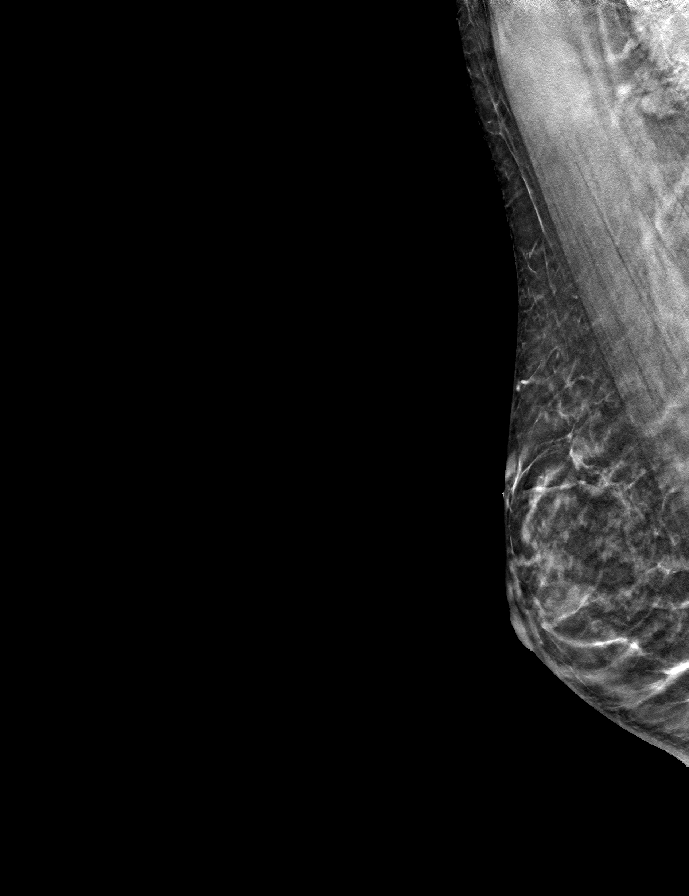

[9 of 24 positions shown; findings below may reference images not displayed]

ACR Breast Density Category b: There are scattered areas of
fibroglandular density.
FINDINGS: There are no findings suspicious for malignancy. Images were
processed with CAD.
IMPRESSION: No mammographic evidence of malignancy. A result letter of this
screening mammogram will be mailed directly to the patient.

RECOMMENDATION:
Screening mammogram in one year. (Code:[id number])

BI-RADS CATEGORY  1: Negative.

## 2021-05-26 ENCOUNTER — Other Ambulatory Visit (HOSPITAL_BASED_OUTPATIENT_CLINIC_OR_DEPARTMENT_OTHER): Payer: Self-pay | Admitting: *Deleted

## 2021-05-26 DIAGNOSIS — K219 Gastro-esophageal reflux disease without esophagitis: Secondary | ICD-10-CM

## 2021-05-26 MED ORDER — PANTOPRAZOLE SODIUM 40 MG PO TBEC: DELAYED_RELEASE_TABLET | ORAL | 2 refills | Status: DC

## 2021-08-22 ENCOUNTER — Other Ambulatory Visit (HOSPITAL_BASED_OUTPATIENT_CLINIC_OR_DEPARTMENT_OTHER): Payer: Self-pay | Admitting: Obstetrics & Gynecology

## 2021-08-22 DIAGNOSIS — K219 Gastro-esophageal reflux disease without esophagitis: Secondary | ICD-10-CM

## 2021-08-26 NOTE — Telephone Encounter (Signed)
LMOVM for pt to call office 

## 2021-08-27 NOTE — Telephone Encounter (Signed)
Informed pt that refill was sent for protonix. Advised that for future refills she should contact her PCP. Pt states that she is no longer using this medication. Pt provided with appt for annual exam.

## 2021-11-08 ENCOUNTER — Other Ambulatory Visit (HOSPITAL_BASED_OUTPATIENT_CLINIC_OR_DEPARTMENT_OTHER): Payer: Self-pay | Admitting: Obstetrics & Gynecology

## 2021-11-10 ENCOUNTER — Other Ambulatory Visit (HOSPITAL_BASED_OUTPATIENT_CLINIC_OR_DEPARTMENT_OTHER): Payer: Self-pay | Admitting: *Deleted

## 2021-11-10 MED ORDER — ESTRADIOL 1 MG PO TABS: 1.0000 mg | ORAL_TABLET | Freq: Every day | ORAL | 0 refills | Status: DC

## 2021-11-10 NOTE — Progress Notes (Signed)
Pharmacy requests refill on estradiol. Pt has appt in December. Refill sent

## 2021-12-06 ENCOUNTER — Other Ambulatory Visit (HOSPITAL_BASED_OUTPATIENT_CLINIC_OR_DEPARTMENT_OTHER): Payer: Self-pay | Admitting: Obstetrics & Gynecology

## 2022-01-01 ENCOUNTER — Ambulatory Visit (HOSPITAL_BASED_OUTPATIENT_CLINIC_OR_DEPARTMENT_OTHER)
Admission: RE | Admit: 2022-01-01 | Discharge: 2022-01-01 | Disposition: A | Discharge status: Home / Self Care | Payer: Commercial Managed Care - PPO | Source: Ambulatory Visit | Attending: Obstetrics & Gynecology | Admitting: Obstetrics & Gynecology

## 2022-01-01 ENCOUNTER — Other Ambulatory Visit (HOSPITAL_COMMUNITY)
Admission: RE | Admit: 2022-01-01 | Discharge: 2022-01-01 | Disposition: A | Discharge status: Home / Self Care | Payer: Commercial Managed Care - PPO | Source: Ambulatory Visit | Attending: Obstetrics & Gynecology | Admitting: Obstetrics & Gynecology

## 2022-01-01 ENCOUNTER — Encounter (HOSPITAL_BASED_OUTPATIENT_CLINIC_OR_DEPARTMENT_OTHER): Payer: Self-pay | Admitting: Obstetrics & Gynecology

## 2022-01-01 ENCOUNTER — Ambulatory Visit (INDEPENDENT_AMBULATORY_CARE_PROVIDER_SITE_OTHER): Payer: Commercial Managed Care - PPO | Admitting: Obstetrics & Gynecology

## 2022-01-01 VITALS — BP 122/89 | HR 63 | Ht 64.5 in | Wt 132.2 lb

## 2022-01-01 DIAGNOSIS — Z8249 Family history of ischemic heart disease and other diseases of the circulatory system: Secondary | ICD-10-CM

## 2022-01-01 DIAGNOSIS — Z124 Encounter for screening for malignant neoplasm of cervix: Secondary | ICD-10-CM | POA: Insufficient documentation

## 2022-01-01 DIAGNOSIS — Z79899 Other long term (current) drug therapy: Secondary | ICD-10-CM

## 2022-01-01 DIAGNOSIS — Z01419 Encounter for gynecological examination (general) (routine) without abnormal findings: Secondary | ICD-10-CM

## 2022-01-01 DIAGNOSIS — N898 Other specified noninflammatory disorders of vagina: Secondary | ICD-10-CM

## 2022-01-01 DIAGNOSIS — R232 Flushing: Secondary | ICD-10-CM

## 2022-01-01 DIAGNOSIS — Z1231 Encounter for screening mammogram for malignant neoplasm of breast: Secondary | ICD-10-CM | POA: Diagnosis present

## 2022-01-01 DIAGNOSIS — R5382 Chronic fatigue, unspecified: Secondary | ICD-10-CM

## 2022-01-01 MED ORDER — PREMARIN 0.625 MG/GM VA CREA: TOPICAL_CREAM | VAGINAL | 1 refills | Status: DC

## 2022-01-01 MED ORDER — PROGESTERONE MICRONIZED 100 MG PO CAPS: 100.0000 mg | ORAL_CAPSULE | Freq: Every day | ORAL | 4 refills | Status: DC

## 2022-01-01 MED ORDER — ESTRADIOL 1 MG PO TABS: 1.0000 mg | ORAL_TABLET | Freq: Every day | ORAL | 4 refills | Status: DC

## 2022-01-01 MED ORDER — VEOZAH 45 MG PO TABS: 45.0000 mg | ORAL_TABLET | Freq: Every day | ORAL | 2 refills | Status: DC

## 2022-01-01 NOTE — Progress Notes (Signed)
51 y.o. G0P0000 Married White or Caucasian female here for annual exam.  Denies vaginal bleeding for over 1 year.  Has been on HRT for about 5 years.  Has a lot of hot flashes when she dried her hair.    Having decreased libido issues.  Doesn't want to be on any more hormonal therapy.  Also having painful intercourse.  Would like this to be better.  Has trip planned to [place] with spouse in February.    Having some vaginal odor.    No LMP recorded. Patient is perimenopausal.          Sexually active: Yes.    The current method of family planning is status post hysterectomy.    Smoker:  no  Health Maintenance: Pap:  01/20/2019 Negative History of abnormal Pap:  LEEP 1998 MMG:  11/10/2018 Negative Colonoscopy:  10/2020 BMD:   not indicated Screening Labs: ordered today   reports that she has never smoked. She has never used smokeless tobacco. She reports that she does not drink alcohol and does not use drugs.  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   IBS (irritable bowel syndrome)     Past Surgical History:  Procedure Laterality Date   BREAST BIOPSY Right 08/10/2012   Procedure: NEEDLE LOCALIZATION REMOVAL RIGHT BREAST CALCIFICATIONS;  Surgeon: Haywood Lasso, MD;  Location: [facility];  Service: General;  Laterality: Right;   BREAST EXCISIONAL BIOPSY Right 2014   Benign    EAR EXAMINATION UNDER ANESTHESIA  1994   left   LEEP  1998   pathology not in [place] - will do pap X 20 years   OOPHORECTOMY Right 1998   benign tumor    Current Outpatient Medications  Medication Sig Dispense Refill   conjugated estrogens (PREMARIN) vaginal cream 1/2 gram vaginally twice weekly. 30 g 1   Docusate Calcium (STOOL SOFTENER PO) Take 1 tablet by mouth 2 (two) times daily.     Fezolinetant (VEOZAH) 45 MG TABS Take 45 mg by mouth daily. 30 tablet 2   Multiple Vitamins-Minerals (MULTIVITAMIN WITH  MINERALS) tablet Take 1 tablet by mouth daily.     pantoprazole (PROTONIX) 40 MG tablet TAKE 1 TABLET(40 MG) BY MOUTH DAILY 30 tablet 2   progesterone (PROMETRIUM) 100 MG capsule Take 1 capsule (100 mg total) by mouth daily. 90 capsule 4   estradiol (ESTRACE) 1 MG tablet Take 1 tablet (1 mg total) by mouth daily. 90 tablet 4   NONFORMULARY OR COMPOUNDED ITEM Boric acid '600mg'$  vaginal suppositories.  One PV nightly x 14 nights (Patient not taking: Reported on 01/01/2022) 14 each 0   No current facility-administered medications for this visit.    Family History  Problem Relation Age of Onset   Hyperlipidemia Mother    Hypertension Mother    Stroke Mother    Diabetes Father    Stroke Maternal Grandmother    Cancer Maternal Grandfather 35       esophageal   Esophageal cancer Maternal Grandfather    Breast cancer Neg Hx    Colon cancer Neg Hx     ROS: Constitutional: negative Genitourinary:negative  Exam:   BP 122/89 (BP Location: Right Arm, Patient Position: Sitting, Cuff Size: Normal)   Pulse 63   Ht 5' 4.5" (1.638 m)   Wt 132 lb 3.2 oz (60 kg)   BMI 22.34 kg/m   Height: 5' 4.5" (163.8 cm)  General appearance: alert, cooperative and appears stated age Head: Normocephalic, without obvious abnormality, atraumatic Neck: no adenopathy, supple, symmetrical, trachea midline and thyroid normal to inspection and palpation Lungs: clear to auscultation bilaterally Breasts: normal appearance, no masses or tenderness Heart: regular rate and rhythm Abdomen: soft, non-tender; bowel sounds normal; no masses,  no organomegaly Extremities: extremities normal, atraumatic, no cyanosis or edema Skin: Skin color, texture, turgor normal. No rashes or lesions Lymph nodes: Cervical, supraclavicular, and axillary nodes normal. No abnormal inguinal nodes palpated Neurologic: Grossly normal   Pelvic: External genitalia:  no lesions              Urethra:  normal appearing urethra with no masses,  tenderness or lesions              Bartholins and Skenes: normal                 Vagina: normal appearing vagina with normal color and no discharge, no lesions              Cervix: no lesions              Pap taken: Yes.   Bimanual Exam:  Uterus:  normal size, contour, position, consistency, mobility, non-tender              Adnexa: normal adnexa and no mass, fullness, tenderness               Rectovaginal: Confirms               Anus:  normal sphincter tone, no lesions  Chaperone, Ezekiel Ina, CMA, was present for exam.  Assessment/Plan: 1. Well woman exam with routine gynecological exam - Pap smear and HR HPV obtained today - Mammogram ordered for today.  Pt is overdue. - Colonoscopy has been declined but pt did have negative cologuard last year - lab work ordered today - vaccines reviewed/updated  2. Cervical cancer screening - Cytology - PAP( [place])  3. Vaginal odor - h/o candida glabrata - Cervicovaginal ancillary only( [place])  4. Encounter for screening mammogram for malignant neoplasm of breast - MM 3D SCREEN BREAST BILATERAL; Future  5. Hot flashes - discussed adding veozah and possibly tapering off HRT - estradiol (ESTRACE) 1 MG tablet; Take 1 tablet (1 mg total) by mouth daily.  Dispense: 90 tablet; Refill: 4 - progesterone (PROMETRIUM) 100 MG capsule; Take 1 capsule (100 mg total) by mouth daily.  Dispense: 90 capsule; Refill: 4  6. High risk medication use - understand prior authorization will likely be needed - Fezolinetant (VEOZAH) 45 MG TABS; Take 45 mg by mouth daily.  Dispense: 30 tablet; Refill: 2  7. Chronic fatigue - TSH - CBC - Comprehensive metabolic panel  8. Family history of cardiovascular disease - Lipid panel - Hemoglobin A1c  9. Vaginal dryness - will add vaginal estrogen cream.  Pt aware will take up to 12 weeks for full response to therapy. - conjugated estrogens (PREMARIN) vaginal cream; 1/2 gram vaginally twice weekly.   Dispense: 30 g; Refill: 1

## 2022-01-02 ENCOUNTER — Other Ambulatory Visit (HOSPITAL_BASED_OUTPATIENT_CLINIC_OR_DEPARTMENT_OTHER): Payer: Self-pay | Admitting: Obstetrics & Gynecology

## 2022-01-02 LAB — COMPREHENSIVE METABOLIC PANEL
ALT: 12 IU/L (ref 0–32)
AST: 24 IU/L (ref 0–40)
Albumin/Globulin Ratio: 1.8 (ref 1.2–2.2)
Albumin: 4.4 g/dL (ref 3.8–4.9)
Alkaline Phosphatase: 88 IU/L (ref 44–121)
BUN/Creatinine Ratio: 13 (ref 9–23)
BUN: 13 mg/dL (ref 6–24)
Bilirubin Total: 0.3 mg/dL (ref 0.0–1.2)
CO2: 24 mmol/L (ref 20–29)
Calcium: 9.4 mg/dL (ref 8.7–10.2)
Chloride: 102 mmol/L (ref 96–106)
Creatinine, Ser: 0.98 mg/dL (ref 0.57–1.00)
Globulin, Total: 2.5 g/dL (ref 1.5–4.5)
Glucose: 85 mg/dL (ref 70–99)
Potassium: 4 mmol/L (ref 3.5–5.2)
Sodium: 140 mmol/L (ref 134–144)
Total Protein: 6.9 g/dL (ref 6.0–8.5)
eGFR: 70 mL/min/{1.73_m2} (ref >59)

## 2022-01-02 LAB — CBC
Hematocrit: 41.2 % (ref 34.0–46.6)
Hemoglobin: 13.7 g/dL (ref 11.1–15.9)
MCH: 29.7 pg (ref 26.6–33.0)
MCHC: 33.3 g/dL (ref 31.5–35.7)
MCV: 89 fL (ref 79–97)
Platelets: 373 10*3/uL (ref 150–450)
RBC: 4.62 x10E6/uL (ref 3.77–5.28)
RDW: 13.1 % (ref 11.7–15.4)
WBC: 4.4 10*3/uL (ref 3.4–10.8)

## 2022-01-02 LAB — CERVICOVAGINAL ANCILLARY ONLY
Bacterial Vaginitis (gardnerella): POSITIVE — AB
Candida Glabrata: NEGATIVE
Candida Vaginitis: NEGATIVE
Comment: NEGATIVE
Comment: NEGATIVE
Comment: NEGATIVE

## 2022-01-02 LAB — HEMOGLOBIN A1C
Est. average glucose Bld gHb Est-mCnc: 114 mg/dL
Hgb A1c MFr Bld: 5.6 % (ref 4.8–5.6)

## 2022-01-02 LAB — LIPID PANEL
Chol/HDL Ratio: 3.2 ratio (ref 0.0–4.4)
Cholesterol, Total: 184 mg/dL (ref 100–199)
HDL: 58 mg/dL (ref >39)
LDL Chol Calc (NIH): 115 mg/dL — ABNORMAL HIGH (ref 0–99)
Triglycerides: 60 mg/dL (ref 0–149)
VLDL Cholesterol Cal: 11 mg/dL (ref 5–40)

## 2022-01-02 LAB — TSH: TSH: 2.8 u[IU]/mL (ref 0.450–4.500)

## 2022-01-02 MED ORDER — METRONIDAZOLE 500 MG PO TABS: 500.0000 mg | ORAL_TABLET | Freq: Two times a day (BID) | ORAL | 0 refills | Status: DC

## 2022-01-05 ENCOUNTER — Other Ambulatory Visit (HOSPITAL_BASED_OUTPATIENT_CLINIC_OR_DEPARTMENT_OTHER): Payer: Self-pay | Admitting: Obstetrics & Gynecology

## 2022-01-06 LAB — CYTOLOGY - PAP
Comment: NEGATIVE
Diagnosis: NEGATIVE
High risk HPV: NEGATIVE

## 2022-01-20 ENCOUNTER — Other Ambulatory Visit (HOSPITAL_BASED_OUTPATIENT_CLINIC_OR_DEPARTMENT_OTHER): Payer: Self-pay | Admitting: Obstetrics & Gynecology

## 2022-01-20 ENCOUNTER — Encounter (HOSPITAL_BASED_OUTPATIENT_CLINIC_OR_DEPARTMENT_OTHER): Payer: Self-pay | Admitting: Obstetrics & Gynecology

## 2022-01-21 ENCOUNTER — Other Ambulatory Visit (HOSPITAL_BASED_OUTPATIENT_CLINIC_OR_DEPARTMENT_OTHER): Payer: Self-pay | Admitting: *Deleted

## 2022-01-21 MED ORDER — METRONIDAZOLE 500 MG PO TABS: 500.0000 mg | ORAL_TABLET | Freq: Two times a day (BID) | ORAL | 0 refills | Status: DC

## 2022-02-02 ENCOUNTER — Encounter (HOSPITAL_BASED_OUTPATIENT_CLINIC_OR_DEPARTMENT_OTHER): Payer: Self-pay | Admitting: Obstetrics & Gynecology

## 2022-03-11 ENCOUNTER — Other Ambulatory Visit (HOSPITAL_BASED_OUTPATIENT_CLINIC_OR_DEPARTMENT_OTHER): Payer: Self-pay | Admitting: Obstetrics & Gynecology

## 2022-03-11 ENCOUNTER — Other Ambulatory Visit (HOSPITAL_BASED_OUTPATIENT_CLINIC_OR_DEPARTMENT_OTHER): Payer: Self-pay

## 2022-03-11 ENCOUNTER — Encounter (HOSPITAL_BASED_OUTPATIENT_CLINIC_OR_DEPARTMENT_OTHER): Payer: Self-pay

## 2022-03-11 DIAGNOSIS — Z79899 Other long term (current) drug therapy: Secondary | ICD-10-CM

## 2022-03-11 MED ORDER — VEOZAH 45 MG PO TABS: 45.0000 mg | ORAL_TABLET | Freq: Every day | ORAL | 9 refills | Status: DC

## 2022-03-11 NOTE — Telephone Encounter (Addendum)
Erroneous encounter

## 2022-03-13 ENCOUNTER — Other Ambulatory Visit (HOSPITAL_BASED_OUTPATIENT_CLINIC_OR_DEPARTMENT_OTHER): Payer: Self-pay | Admitting: Obstetrics & Gynecology

## 2022-03-13 DIAGNOSIS — Z79899 Other long term (current) drug therapy: Secondary | ICD-10-CM

## 2022-03-18 ENCOUNTER — Encounter (HOSPITAL_BASED_OUTPATIENT_CLINIC_OR_DEPARTMENT_OTHER): Payer: Self-pay | Admitting: Obstetrics & Gynecology

## 2022-03-27 ENCOUNTER — Encounter (HOSPITAL_BASED_OUTPATIENT_CLINIC_OR_DEPARTMENT_OTHER): Payer: Self-pay | Admitting: Obstetrics & Gynecology

## 2022-04-01 ENCOUNTER — Other Ambulatory Visit (HOSPITAL_BASED_OUTPATIENT_CLINIC_OR_DEPARTMENT_OTHER): Payer: Self-pay

## 2022-04-01 DIAGNOSIS — Z79899 Other long term (current) drug therapy: Secondary | ICD-10-CM

## 2022-04-01 MED ORDER — VEOZAH 45 MG PO TABS: 45.0000 mg | ORAL_TABLET | Freq: Every day | ORAL | 9 refills | Status: DC

## 2022-12-14 ENCOUNTER — Encounter (HOSPITAL_BASED_OUTPATIENT_CLINIC_OR_DEPARTMENT_OTHER): Payer: Self-pay | Admitting: Obstetrics & Gynecology

## 2022-12-14 ENCOUNTER — Other Ambulatory Visit (HOSPITAL_BASED_OUTPATIENT_CLINIC_OR_DEPARTMENT_OTHER): Payer: Self-pay | Admitting: Certified Nurse Midwife

## 2022-12-14 ENCOUNTER — Other Ambulatory Visit (HOSPITAL_BASED_OUTPATIENT_CLINIC_OR_DEPARTMENT_OTHER): Payer: Commercial Managed Care - PPO

## 2022-12-14 DIAGNOSIS — L299 Pruritus, unspecified: Secondary | ICD-10-CM

## 2022-12-14 LAB — COMPREHENSIVE METABOLIC PANEL
ALT: 13 [IU]/L (ref 0–32)
AST: 26 [IU]/L (ref 0–40)
Albumin: 4.3 g/dL (ref 3.8–4.9)
Alkaline Phosphatase: 92 [IU]/L (ref 44–121)
BUN/Creatinine Ratio: 11 (ref 9–23)
BUN: 11 mg/dL (ref 6–24)
Bilirubin Total: 0.3 mg/dL (ref 0.0–1.2)
CO2: 25 mmol/L (ref 20–29)
Calcium: 9.6 mg/dL (ref 8.7–10.2)
Chloride: 103 mmol/L (ref 96–106)
Creatinine, Ser: 0.98 mg/dL (ref 0.57–1.00)
Globulin, Total: 2.7 g/dL (ref 1.5–4.5)
Glucose: 104 mg/dL — ABNORMAL HIGH (ref 70–99)
Potassium: 4.3 mmol/L (ref 3.5–5.2)
Sodium: 144 mmol/L (ref 134–144)
Total Protein: 7 g/dL (ref 6.0–8.5)
eGFR: 69 mL/min/{1.73_m2} (ref >59)

## 2022-12-14 LAB — CBC
Hematocrit: 42.9 % (ref 34.0–46.6)
Hemoglobin: 14.2 g/dL (ref 11.1–15.9)
MCH: 29.6 pg (ref 26.6–33.0)
MCHC: 33.1 g/dL (ref 31.5–35.7)
MCV: 90 fL (ref 79–97)
Platelets: 379 10*3/uL (ref 150–450)
RBC: 4.79 x10E6/uL (ref 3.77–5.28)
RDW: 12.9 % (ref 11.7–15.4)
WBC: 5.6 10*3/uL (ref 3.4–10.8)

## 2022-12-21 ENCOUNTER — Other Ambulatory Visit (HOSPITAL_BASED_OUTPATIENT_CLINIC_OR_DEPARTMENT_OTHER): Payer: Self-pay | Admitting: Obstetrics & Gynecology

## 2022-12-21 DIAGNOSIS — Z79899 Other long term (current) drug therapy: Secondary | ICD-10-CM

## 2022-12-25 ENCOUNTER — Other Ambulatory Visit (HOSPITAL_BASED_OUTPATIENT_CLINIC_OR_DEPARTMENT_OTHER): Payer: Self-pay | Admitting: *Deleted

## 2022-12-25 DIAGNOSIS — R232 Flushing: Secondary | ICD-10-CM

## 2022-12-25 DIAGNOSIS — Z79899 Other long term (current) drug therapy: Secondary | ICD-10-CM

## 2022-12-25 MED ORDER — VEOZAH 45 MG PO TABS: 45.0000 mg | ORAL_TABLET | Freq: Every day | ORAL | 2 refills | Status: DC

## 2023-01-07 ENCOUNTER — Ambulatory Visit (HOSPITAL_BASED_OUTPATIENT_CLINIC_OR_DEPARTMENT_OTHER): Payer: Commercial Managed Care - PPO | Admitting: Obstetrics & Gynecology

## 2023-02-22 ENCOUNTER — Encounter (HOSPITAL_BASED_OUTPATIENT_CLINIC_OR_DEPARTMENT_OTHER): Payer: Self-pay | Admitting: Obstetrics & Gynecology

## 2023-02-23 ENCOUNTER — Other Ambulatory Visit (HOSPITAL_BASED_OUTPATIENT_CLINIC_OR_DEPARTMENT_OTHER): Payer: Self-pay | Admitting: Obstetrics & Gynecology

## 2023-02-23 DIAGNOSIS — R232 Flushing: Secondary | ICD-10-CM

## 2023-02-23 MED ORDER — VEOZAH 45 MG PO TABS: 45.0000 mg | ORAL_TABLET | Freq: Every day | ORAL | 12 refills | Status: AC

## 2023-02-26 ENCOUNTER — Encounter (HOSPITAL_BASED_OUTPATIENT_CLINIC_OR_DEPARTMENT_OTHER): Payer: Self-pay | Admitting: Radiology

## 2023-02-26 ENCOUNTER — Other Ambulatory Visit (HOSPITAL_BASED_OUTPATIENT_CLINIC_OR_DEPARTMENT_OTHER): Payer: Self-pay | Admitting: Obstetrics & Gynecology

## 2023-02-26 ENCOUNTER — Other Ambulatory Visit (HOSPITAL_COMMUNITY)
Admission: RE | Admit: 2023-02-26 | Discharge: 2023-02-26 | Disposition: A | Discharge status: Home / Self Care | Payer: Commercial Managed Care - PPO | Source: Ambulatory Visit | Attending: Obstetrics & Gynecology | Admitting: Obstetrics & Gynecology

## 2023-02-26 ENCOUNTER — Ambulatory Visit (HOSPITAL_BASED_OUTPATIENT_CLINIC_OR_DEPARTMENT_OTHER): Payer: Commercial Managed Care - PPO | Admitting: Obstetrics & Gynecology

## 2023-02-26 ENCOUNTER — Encounter (HOSPITAL_BASED_OUTPATIENT_CLINIC_OR_DEPARTMENT_OTHER): Payer: Self-pay | Admitting: Obstetrics & Gynecology

## 2023-02-26 ENCOUNTER — Ambulatory Visit (HOSPITAL_BASED_OUTPATIENT_CLINIC_OR_DEPARTMENT_OTHER)
Admission: RE | Admit: 2023-02-26 | Discharge: 2023-02-26 | Disposition: A | Discharge status: Home / Self Care | Payer: Commercial Managed Care - PPO | Source: Ambulatory Visit | Attending: Obstetrics & Gynecology | Admitting: Obstetrics & Gynecology

## 2023-02-26 VITALS — BP 105/85 | HR 61 | Ht 64.25 in | Wt 129.8 lb

## 2023-02-26 DIAGNOSIS — Z1231 Encounter for screening mammogram for malignant neoplasm of breast: Secondary | ICD-10-CM

## 2023-02-26 DIAGNOSIS — R232 Flushing: Secondary | ICD-10-CM | POA: Diagnosis not present

## 2023-02-26 DIAGNOSIS — N898 Other specified noninflammatory disorders of vagina: Secondary | ICD-10-CM | POA: Insufficient documentation

## 2023-02-26 DIAGNOSIS — Z01419 Encounter for gynecological examination (general) (routine) without abnormal findings: Secondary | ICD-10-CM | POA: Diagnosis not present

## 2023-02-26 MED ORDER — ESTRADIOL 1 MG PO TABS: 1.0000 mg | ORAL_TABLET | Freq: Every day | ORAL | 4 refills | Status: AC

## 2023-02-26 MED ORDER — PROGESTERONE MICRONIZED 100 MG PO CAPS: 100.0000 mg | ORAL_CAPSULE | Freq: Every day | ORAL | 4 refills | Status: AC

## 2023-02-26 MED ORDER — PREMARIN 0.625 MG/GM VA CREA: TOPICAL_CREAM | VAGINAL | 1 refills | Status: AC

## 2023-02-26 NOTE — Progress Notes (Addendum)
 ANNUAL EXAM Patient name: Elaine Edwards MRN [id number]  Date of birth: 05-Mar-1970 Chief Complaint:   AEX  History of Present Illness:   Elaine Edwards is a 53 y.o. G0P0000 Caucasian female being seen today for a routine annual exam.  Doing well.  Did well on Veozah but having trouble with getting it refilled.  Info for savings program done today.   Stopped her estrogen and has really felt this was not a good idea.  She has restarted this.  Hot flashes are much better controlled on the veozah.  Estradiol was not nearly adequate enough.  She has used the premarin vaginal cream and this helped.  Needs RF as tube is expired.    Denies vaginal bleeding.  She is PMP.  Does have some recurrent vaginal odor present.  H/o candida glabrata and then BV x two (10/24/202 and 11/01/2021).  Feels one of these is likely back.  Will retest today.  Last pap 12/2021. Results were:  neg with neg HR HPV . H/O abnormal pap: yes about 30 years ago Last mammogram: 12/2021. Results were: normal. Family h/o breast cancer: no Last colonoscopy: cologuard 10/2020. Results were: normal. Family h/o colorectal cancer: no     01/01/2022    8:13 AM 09/26/2020    3:27 PM  Depression screen PHQ 2/9  Decreased Interest 0 0  Down, Depressed, Hopeless 0 0  PHQ - 2 Score 0 0     Review of Systems:   Pertinent items are noted in HPI Denies any headaches, blurred vision, fatigue, shortness of breath, chest pain, abdominal pain, abnormal vaginal discharge/itching, problems with periods, bowel movements, urination, or intercourse unless otherwise stated above. Pertinent History Reviewed:  Reviewed past medical,surgical, social and family history.  Reviewed problem list, medications and allergies. Physical Assessment:   Vitals:   02/26/23 1128  BP: 105/85  Pulse: 61  Weight: 129 lb 12.8 oz (58.9 kg)  Height: 5' 4.25" (1.632 m)  Body mass index is 22.11 kg/m.        Physical Examination:   General  appearance - well appearing, and in no distress  Mental status - alert, oriented to person, place, and time  Psych:  She has a normal mood and affect  Skin - warm and dry, normal color, no suspicious lesions noted  Chest - effort normal, all lung fields clear to auscultation bilaterally  Heart - normal rate and regular rhythm  Neck:  midline trachea, no thyromegaly or nodules  Breasts - breasts appear normal, no suspicious masses, no skin or nipple changes or  axillary nodes  Abdomen - soft, nontender, nondistended, no masses or organomegaly  Pelvic - VULVA: normal appearing vulva with no masses, tenderness, single skin tag on lower right vulvar noted, stable. Pt declined remainder of pelvic exam.  Extremities:  No swelling or varicosities noted  Chaperone present for exam.   Assessment & Plan:  1. Well woman exam with routine gynecological exam (Primary) - Pap smear 01/01/2022 - Mammogram 12/2021.  Aware this is due.  Called downstairs and they can work her in today. - Colonoscopy declined.  Had cologuard in 10/2020.  Will need again this year.  Reminder placed.  - Bone mineral density not indicated yet - vaccines reviewed/updated.  She is aware tdap due and if has exposure, should update.  2. Vaginal dryness - need RF at tube expired a few months after she got from pharmacy.  Showed her where to check for expiration and to make  sure this next one has longer shelf life - conjugated estrogens (PREMARIN) vaginal cream; 1/2 gram vaginally twice weekly.  Dispense: 30 g; Refill: 1  3. Hot flashes - pt has failed HRT alone for treatment of hot flashes.  Rx for Allyne Gee already to pharmacy.  She had normal liver enzymes obtained 12/14/2022.  Repeat not needed for 1 year.   - progesterone (PROMETRIUM) 100 MG capsule; Take 1 capsule (100 mg total) by mouth daily.  Dispense: 90 capsule; Refill: 4 - estradiol (ESTRACE) 1 MG tablet; Take 1 tablet (1 mg total) by mouth daily.  Dispense: 90 tablet;  Refill: 4  4. Vaginal odor - Cervicovaginal ancillary only( )  5. Encounter for screening mammogram for malignant neoplasm of breast     Meds:  Meds ordered this encounter  Medications   conjugated estrogens (PREMARIN) vaginal cream    Sig: 1/2 gram vaginally twice weekly.    Dispense:  30 g    Refill:  1   progesterone (PROMETRIUM) 100 MG capsule    Sig: Take 1 capsule (100 mg total) by mouth daily.    Dispense:  90 capsule    Refill:  4   estradiol (ESTRACE) 1 MG tablet    Sig: Take 1 tablet (1 mg total) by mouth daily.    Dispense:  90 tablet    Refill:  4    Follow-up: Return in about 1 year (around 02/26/2024).  Jerene Bears, MD 02/26/2023 1:17 PM

## 2023-03-01 LAB — CERVICOVAGINAL ANCILLARY ONLY
Bacterial Vaginitis (gardnerella): POSITIVE — AB
Candida Glabrata: NEGATIVE
Candida Vaginitis: NEGATIVE
Comment: NEGATIVE
Comment: NEGATIVE
Comment: NEGATIVE

## 2023-03-04 ENCOUNTER — Other Ambulatory Visit (HOSPITAL_BASED_OUTPATIENT_CLINIC_OR_DEPARTMENT_OTHER): Payer: Self-pay | Admitting: *Deleted

## 2023-03-04 MED ORDER — METRONIDAZOLE 500 MG PO TABS: 500.0000 mg | ORAL_TABLET | Freq: Two times a day (BID) | ORAL | 0 refills | Status: DC

## 2023-03-04 NOTE — Progress Notes (Signed)
Rx sent to pharmacy for treatment of BV 

## 2023-05-10 ENCOUNTER — Encounter (HOSPITAL_BASED_OUTPATIENT_CLINIC_OR_DEPARTMENT_OTHER): Payer: Self-pay | Admitting: Obstetrics & Gynecology

## 2023-05-11 NOTE — Telephone Encounter (Signed)
 Called pt in response to myChart message. Pt provided with appt to discuss hot flashes and vaginal discharge symptoms

## 2023-05-18 ENCOUNTER — Encounter (HOSPITAL_BASED_OUTPATIENT_CLINIC_OR_DEPARTMENT_OTHER): Payer: Self-pay | Admitting: Obstetrics & Gynecology

## 2023-05-18 ENCOUNTER — Ambulatory Visit (HOSPITAL_BASED_OUTPATIENT_CLINIC_OR_DEPARTMENT_OTHER): Admitting: Obstetrics & Gynecology

## 2023-05-18 VITALS — BP 117/86 | HR 62 | Ht 64.25 in | Wt 129.0 lb

## 2023-05-18 DIAGNOSIS — R232 Flushing: Secondary | ICD-10-CM | POA: Diagnosis not present

## 2023-05-18 MED ORDER — TINIDAZOLE 500 MG PO TABS: 2.0000 g | ORAL_TABLET | Freq: Every day | ORAL | 0 refills | Status: AC

## 2023-05-18 NOTE — Progress Notes (Unsigned)
 GYNECOLOGY  VISIT  CC:   concerns about hot flashes and BV  HPI: 53 y.o. G0P0000 Married White or Caucasian female here for concerns about hot flashes.  She has been on Veozah .  This was working well until a last month.  She was taking supplements and she wondered if this was related.  She stopped one of them about two weeks ago.  This hasn't really made any difference.  She is currently on estradiol  1 mg and 100 mg of Prometrium  as well as the 75 mg of Veozah .  She is still taking the Veozah .  Discussed with patient typical HRT dosing.  Although I do not currently have anybody on a 1 mg twice daily dosing, we could increase up to that dosing.  At that point, I feel comfortable changing her estrogen.  It rising to have very manageable symptoms that just all of a sudden changed with no adjustments in therapy.  I do feel we should do a little blood work today.  She is comfortable with this.  She has been having BV as above problem with menopause as well.  She does not like vaginal treatments.  She has taken Flagyl  in the past but it does give her side effects.  The last treatment she did not complete.  She does feel she still symptoms.  She would prefer not to have a swab done today.  Elaine Edwards was March 02, 2023.  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 30 g 1   Docusate Calcium (STOOL SOFTENER PO) Take 1 tablet by mouth 2 (two) times daily.     estradiol  (ESTRACE ) 1 MG tablet Take 1 tablet (1 mg total) by mouth daily. 90 tablet 4   Fezolinetant  (VEOZAH ) 45 MG TABS Take 1 tablet (45 mg total) by mouth daily. 30 tablet 12   Multiple Vitamins-Minerals (MULTIVITAMIN WITH  MINERALS) tablet Take 1 tablet by mouth daily.     progesterone  (PROMETRIUM ) 100 MG capsule Take 1 capsule (100 mg total) by mouth daily. 90 capsule 4   No current facility-administered medications on file prior to visit.    ALLERGIES: Patient has no known allergies.  SH:  married, non smoker  Review of Systems  Constitutional:        Hot flashes    PHYSICAL EXAMINATION:    BP 117/86 (BP Location: Left Arm, Patient Position: Sitting, Cuff Size: Normal)   Pulse 62   Ht 5' 4.25" (1.632 m)   Wt 129 lb (58.5 kg)   LMP 12/20/2018   BMI 21.97 kg/m     Physical Exam Constitutional:      Appearance: Normal appearance.  Neurological:     General: No focal deficit present.     Mental Status: She is alert.  Psychiatric:        Mood and Affect: Mood normal.     Assessment/Plan: 1. Hot flashes (Primary) - Testing for thyroid disorder, diabetes and checking her white count be obtained today. - TSH - Hemoglobin A1c - CBC with Differential/Platelet  - Is going to stop all of her supplements.  I am concerned 1 of these may be interacting with either the  Veozah  or her HRT.  She could give an update in 2 weeks. - Currently she does not feel comfortable increasing estradiol  to 1 mg twice daily.  Total time with pt 26 minutes Documentation: 6 minutes Total time:  32 minutes

## 2023-05-19 ENCOUNTER — Ambulatory Visit (HOSPITAL_BASED_OUTPATIENT_CLINIC_OR_DEPARTMENT_OTHER): Payer: Self-pay | Admitting: Obstetrics & Gynecology

## 2023-05-19 LAB — HEMOGLOBIN A1C
Est. average glucose Bld gHb Est-mCnc: 111 mg/dL
Hgb A1c MFr Bld: 5.5 % (ref 4.8–5.6)

## 2023-05-19 LAB — CBC WITH DIFFERENTIAL/PLATELET
Basophils Absolute: 0 10*3/uL (ref 0.0–0.2)
Basos: 0 %
EOS (ABSOLUTE): 0.1 10*3/uL (ref 0.0–0.4)
Eos: 2 %
Hematocrit: 43.5 % (ref 34.0–46.6)
Hemoglobin: 14.2 g/dL (ref 11.1–15.9)
Immature Grans (Abs): 0 10*3/uL (ref 0.0–0.1)
Immature Granulocytes: 0 %
Lymphocytes Absolute: 1.8 10*3/uL (ref 0.7–3.1)
Lymphs: 32 %
MCH: 30.1 pg (ref 26.6–33.0)
MCHC: 32.6 g/dL (ref 31.5–35.7)
MCV: 92 fL (ref 79–97)
Monocytes Absolute: 0.5 10*3/uL (ref 0.1–0.9)
Monocytes: 9 %
Neutrophils Absolute: 3.1 10*3/uL (ref 1.4–7.0)
Neutrophils: 57 %
Platelets: 370 10*3/uL (ref 150–450)
RBC: 4.71 x10E6/uL (ref 3.77–5.28)
RDW: 13 % (ref 11.7–15.4)
WBC: 5.5 10*3/uL (ref 3.4–10.8)

## 2023-05-19 LAB — TSH: TSH: 2.79 u[IU]/mL (ref 0.450–4.500)

## 2023-05-21 ENCOUNTER — Encounter (HOSPITAL_BASED_OUTPATIENT_CLINIC_OR_DEPARTMENT_OTHER): Payer: Self-pay | Admitting: Obstetrics & Gynecology

## 2023-10-25 ENCOUNTER — Other Ambulatory Visit (HOSPITAL_BASED_OUTPATIENT_CLINIC_OR_DEPARTMENT_OTHER): Payer: Self-pay | Admitting: Obstetrics & Gynecology

## 2023-10-25 ENCOUNTER — Encounter (HOSPITAL_BASED_OUTPATIENT_CLINIC_OR_DEPARTMENT_OTHER): Payer: Self-pay | Admitting: Obstetrics & Gynecology

## 2023-10-25 DIAGNOSIS — Z1211 Encounter for screening for malignant neoplasm of colon: Secondary | ICD-10-CM

## 2023-10-28 ENCOUNTER — Other Ambulatory Visit (HOSPITAL_BASED_OUTPATIENT_CLINIC_OR_DEPARTMENT_OTHER): Payer: Self-pay

## 2023-10-28 DIAGNOSIS — Z7689 Persons encountering health services in other specified circumstances: Secondary | ICD-10-CM

## 2024-02-01 ENCOUNTER — Encounter (HOSPITAL_BASED_OUTPATIENT_CLINIC_OR_DEPARTMENT_OTHER): Payer: Self-pay | Admitting: Obstetrics & Gynecology

## 2024-03-17 ENCOUNTER — Ambulatory Visit (HOSPITAL_BASED_OUTPATIENT_CLINIC_OR_DEPARTMENT_OTHER): Admitting: Obstetrics & Gynecology
